# Patient Record
Sex: Male | Born: 1970
Health system: Southern US, Community
[De-identification: ages and names within clinical notes are randomized; demographics above are authoritative.]

## PROBLEM LIST (undated history)

## (undated) DIAGNOSIS — M5416 Radiculopathy, lumbar region: Secondary | ICD-10-CM

## (undated) DIAGNOSIS — S83512A Sprain of anterior cruciate ligament of left knee, initial encounter: Secondary | ICD-10-CM

## (undated) DIAGNOSIS — Z87898 Personal history of other specified conditions: Secondary | ICD-10-CM

## (undated) DIAGNOSIS — K219 Gastro-esophageal reflux disease without esophagitis: Secondary | ICD-10-CM

## (undated) DIAGNOSIS — M545 Low back pain: Principal | ICD-10-CM

## (undated) DIAGNOSIS — Z8719 Personal history of other diseases of the digestive system: Secondary | ICD-10-CM

## (undated) DIAGNOSIS — Z87442 Personal history of urinary calculi: Secondary | ICD-10-CM

## (undated) DIAGNOSIS — M5430 Sciatica, unspecified side: Secondary | ICD-10-CM

## (undated) DIAGNOSIS — R519 Headache, unspecified: Secondary | ICD-10-CM

## (undated) DIAGNOSIS — M1990 Unspecified osteoarthritis, unspecified site: Secondary | ICD-10-CM

## (undated) DIAGNOSIS — G8929 Other chronic pain: Secondary | ICD-10-CM

## (undated) HISTORY — PX: CYST REMOVAL NECK: SHX6281

## (undated) HISTORY — DX: Sciatica, unspecified side: M54.30

## (undated) HISTORY — DX: Sprain of anterior cruciate ligament of left knee, initial encounter: S83.512A

## (undated) HISTORY — DX: Other chronic pain: G89.29

## (undated) HISTORY — PX: KNEE ARTHROSCOPY: SUR90

## (undated) HISTORY — DX: Personal history of other specified conditions: Z87.898

## (undated) HISTORY — DX: Radiculopathy, lumbar region: M54.16

## (undated) HISTORY — DX: Gastro-esophageal reflux disease without esophagitis: K21.9

## (undated) HISTORY — DX: Low back pain: M54.5

## (undated) HISTORY — PX: FOOT SURGERY: SHX648

---

## 2014-12-08 DIAGNOSIS — S83512A Sprain of anterior cruciate ligament of left knee, initial encounter: Secondary | ICD-10-CM

## 2014-12-08 HISTORY — DX: Sprain of anterior cruciate ligament of left knee, initial encounter: S83.512A

## 2016-01-09 ENCOUNTER — Encounter: Payer: Self-pay | Admitting: Family Medicine

## 2016-01-09 ENCOUNTER — Ambulatory Visit (INDEPENDENT_AMBULATORY_CARE_PROVIDER_SITE_OTHER): Payer: BLUE CROSS/BLUE SHIELD | Admitting: Family Medicine

## 2016-01-09 VITALS — BP 139/88 | HR 54 | Ht 65.0 in | Wt 150.6 lb

## 2016-01-09 DIAGNOSIS — M5430 Sciatica, unspecified side: Secondary | ICD-10-CM

## 2016-01-09 DIAGNOSIS — M545 Low back pain, unspecified: Secondary | ICD-10-CM

## 2016-01-09 DIAGNOSIS — Z87898 Personal history of other specified conditions: Secondary | ICD-10-CM

## 2016-01-09 DIAGNOSIS — M5431 Sciatica, right side: Secondary | ICD-10-CM

## 2016-01-09 DIAGNOSIS — G8929 Other chronic pain: Secondary | ICD-10-CM

## 2016-01-09 DIAGNOSIS — M5416 Radiculopathy, lumbar region: Secondary | ICD-10-CM | POA: Insufficient documentation

## 2016-01-09 DIAGNOSIS — K219 Gastro-esophageal reflux disease without esophagitis: Secondary | ICD-10-CM | POA: Diagnosis not present

## 2016-01-09 HISTORY — DX: Sciatica, unspecified side: M54.30

## 2016-01-09 HISTORY — DX: Radiculopathy, lumbar region: M54.16

## 2016-01-09 HISTORY — DX: Low back pain, unspecified: M54.50

## 2016-01-09 MED ORDER — PREDNISONE 10 MG (48) PO TBPK: ORAL_TABLET | Freq: Every day | ORAL | 0 refills | Status: DC

## 2016-01-09 MED ORDER — SCOPOLAMINE 1 MG/3DAYS TD PT72: 1.0000 | MEDICATED_PATCH | TRANSDERMAL | 0 refills | Status: DC

## 2016-01-09 MED ORDER — AMITRIPTYLINE HCL 25 MG PO TABS: ORAL_TABLET | ORAL | 0 refills | Status: DC

## 2016-01-09 MED ORDER — GABAPENTIN 100 MG PO CAPS: ORAL_CAPSULE | ORAL | 0 refills | Status: DC

## 2016-01-09 NOTE — Patient Instructions (Signed)
May ramp up your Neurontin to 3 tablets 3 times daily as needed if you tolerated.     Lumbosacral Radiculopathy Lumbosacral radiculopathy is a condition that involves the spinal nerves and nerve roots in the low back and bottom of the spine. The condition develops when these nerves and nerve roots move out of place or become inflamed and cause symptoms. CAUSES This condition may be caused by:  Pressure from a disk that bulges out of place (herniated disk). A disk is a plate of cartilage that separates bones in the spine.  Disk degeneration.  A narrowing of the bones of the lower back (spinal stenosis).  A tumor.  An infection.  An injury that places sudden pressure on the disks that cushion the bones of your lower spine. RISK FACTORS This condition is more likely to develop in:  Males aged 30-50 years.  Females aged 50-60 years.  People who lift improperly.  People who are overweight or live a sedentary lifestyle.  People who smoke.  People who perform repetitive activities that strain the spine. SYMPTOMS Symptoms of this condition include:  Pain that goes down from the back into the legs (sciatica). This is the most common symptom. The pain may be worse with sitting, coughing, or sneezing.  Pain and numbness in the arms and legs.  Muscle weakness.  Tingling.  Loss of bladder control or bowel control. DIAGNOSIS This condition is diagnosed with a physical exam and medical history. If the pain is lasting, you may have tests, such as:  MRI scan.  X-ray.  CT scan.  Myelogram.  Nerve conduction study. TREATMENT This condition is often treated with:  Hot packs and ice applied to affected areas.  Stretches to improve flexibility.  Exercises to strengthen back muscles.  Physical therapy.  Pain medicine.  A steroid injection in the spine. In some cases, no treatment is needed. If the condition is long-lasting (chronic), or if symptoms are severe,  treatment may involve surgery or lifestyle changes, such as following a weight loss plan. HOME CARE INSTRUCTIONS Medicines  Take medicines only as directed by your health care provider.  Do not drive or operate heavy machinery while taking pain medicine. Injury Care  Apply a heat pack to the injured area as directed by your health care provider.  Apply ice to the affected area:  Put ice in a plastic bag.  Place a towel between your skin and the bag.  Leave the ice on for 20-30 minutes, every 2 hours while you are awake or as needed. Or, leave the ice on for as long as directed by your health care provider. Other Instructions  If you were shown how to do any exercises or stretches, do them as directed by your health care provider.  If your health care provider prescribed a diet or exercise program, follow it as directed.  Keep all follow-up visits as directed by your health care provider. This is important. SEEK MEDICAL CARE IF:  Your pain does not improve over time even when taking pain medicines. SEEK IMMEDIATE MEDICAL CARE IF:  Your develop severe pain.  Your pain suddenly gets worse.  You develop increasing weakness in your legs.  You lose the ability to control your bladder or bowel.  You have difficulty walking or balancing.  You have a fever.   This information is not intended to replace advice given to you by your health care provider. Make sure you discuss any questions you have with your health care provider.  Document Released: 05/27/2005 Document Revised: 10/11/2014 Document Reviewed: 05/23/2014 Elsevier Interactive Patient Education 2016 [employer]   Sciatica Sciatica is pain, weakness, numbness, or tingling along the path of the sciatic nerve. The nerve starts in the lower back and runs down the back of each leg. The nerve controls the muscles in the lower leg and in the back of the knee, while also providing sensation to the back of the thigh, lower  leg, and the sole of your foot. Sciatica is a symptom of another medical condition. For instance, nerve damage or certain conditions, such as a herniated disk or bone spur on the spine, pinch or put pressure on the sciatic nerve. This causes the pain, weakness, or other sensations normally associated with sciatica. Generally, sciatica only affects one side of the body. CAUSES   Herniated or slipped disc.  Degenerative disk disease.  A pain disorder involving the narrow muscle in the buttocks (piriformis syndrome).  Pelvic injury or fracture.  Pregnancy.  Tumor (rare). SYMPTOMS  Symptoms can vary from mild to very severe. The symptoms usually travel from the low back to the buttocks and down the back of the leg. Symptoms can include:  Mild tingling or dull aches in the lower back, leg, or hip.  Numbness in the back of the calf or sole of the foot.  Burning sensations in the lower back, leg, or hip.  Sharp pains in the lower back, leg, or hip.  Leg weakness.  Severe back pain inhibiting movement. These symptoms may get worse with coughing, sneezing, laughing, or prolonged sitting or standing. Also, being overweight may worsen symptoms. DIAGNOSIS  Your caregiver will perform a physical exam to look for common symptoms of sciatica. He or she may ask you to do certain movements or activities that would trigger sciatic nerve pain. Other tests may be performed to find the cause of the sciatica. These may include:  Blood tests.  X-rays.  Imaging tests, such as an MRI or CT scan. TREATMENT  Treatment is directed at the cause of the sciatic pain. Sometimes, treatment is not necessary and the pain and discomfort goes away on its own. If treatment is needed, your caregiver may suggest:  Over-the-counter medicines to relieve pain.  Prescription medicines, such as anti-inflammatory medicine, muscle relaxants, or narcotics.  Applying heat or ice to the painful area.  Steroid injections  to lessen pain, irritation, and inflammation around the nerve.  Reducing activity during periods of pain.  Exercising and stretching to strengthen your abdomen and improve flexibility of your spine. Your caregiver may suggest losing weight if the extra weight makes the back pain worse.  Physical therapy.  Surgery to eliminate what is pressing or pinching the nerve, such as a bone spur or part of a herniated disk. HOME CARE INSTRUCTIONS   Only take over-the-counter or prescription medicines for pain or discomfort as directed by your caregiver.  Apply ice to the affected area for 20 minutes, 3-4 times a day for the first 48-72 hours. Then try heat in the same way.  Exercise, stretch, or perform your usual activities if these do not aggravate your pain.  Attend physical therapy sessions as directed by your caregiver.  Keep all follow-up appointments as directed by your caregiver.  Do not wear high heels or shoes that do not provide proper support.  Check your mattress to see if it is too soft. A firm mattress may lessen your pain and discomfort. SEEK IMMEDIATE MEDICAL CARE IF:   You  lose control of your bowel or bladder (incontinence).  You have increasing weakness in the lower back, pelvis, buttocks, or legs.  You have redness or swelling of your back.  You have a burning sensation when you urinate.  You have pain that gets worse when you lie down or awakens you at night.  Your pain is worse than you have experienced in the past.  Your pain is lasting longer than 4 weeks.  You are suddenly losing weight without reason. MAKE SURE YOU:  Understand these instructions.  Will watch your condition.  Will get help right away if you are not doing well or get worse.   This information is not intended to replace advice given to you by your health care provider. Make sure you discuss any questions you have with your health care provider.   Document Released: 05/21/2001 Document  Revised: 02/15/2015 Document Reviewed: 10/06/2011 Elsevier Interactive Patient Education [employer]

## 2016-01-09 NOTE — Progress Notes (Signed)
New patient office visit note:  Impression and Recommendations:    H/O motion sickness/ seasickness  Scopolamine patch prescription given.   Advised deep sea fishing would be very difficult on his back, and I do not recommend he goes  Right lumbar radiculopathy  His chronic low back pain has gotten worse over the past couple weeks. We will obtain an MRI.    Medications started (which patient has been on in the past for this same condition over a yr ago)  - Neurontin: Patient understands incremental increasing in dose slowly over time  -Elavil at night  -Prednisone for his recent aggravation of his back pain  Sciatic nerve pain- R  Red flag symptoms discussed and patient knows it is an emergency if he develops any of those.  GERD (gastroesophageal reflux disease)  Declines refill on his omeprazole  Chronic lower back pain  I did use 1:30 day supply of Vicodin in the past as needed for short periods of time when his pain became severe like it is now.  Patient declines these meds today. Orders Placed This Encounter  Procedures  . MR Lumbar Spine W Wo Contrast    Patient's Medications  New Prescriptions   AMITRIPTYLINE (ELAVIL) 25 MG TABLET    1-2 daily at bedtime when necessary pain   GABAPENTIN (NEURONTIN) 100 MG CAPSULE    Take 1 tablet nightly for 3 nights, then 1 tab twice a day for 3 days, then one tab 3 times a day   PREDNISONE (STERAPRED UNI-PAK 48 TAB) 10 MG (48) TBPK TABLET    Take by mouth daily. 12 day dosepack po   SCOPOLAMINE (TRANSDERM-SCOP, 1.5 MG,) 1 MG/3DAYS    Place 1 patch (1.5 mg total) onto the skin every 3 (three) days.  Previous Medications   No medications on file  Modified Medications   Modified Medication Previous Medication   OMEPRAZOLE (PRILOSEC) 20 MG CAPSULE omeprazole (PRILOSEC) 20 MG capsule      Take 1 capsule (20 mg total) by mouth daily.    Take 20 mg by mouth daily.  Discontinued Medications   No medications on file     Return in about 3 weeks (around 01/30/2016) for 3-4 wk Follow-up of back pain and starting Neurontin, Elavil, prednisone; follow-up MRI.  The patient was counseled, risk factors were discussed, anticipatory guidance given.  Gross side effects, risk and benefits, and alternatives of medications discussed with patient.  Patient is aware that all medications have potential side effects and we are unable to predict every side effect or drug-drug interaction that may occur.  Expresses verbal understanding and consents to current therapy plan and treatment regimen.  Please see AVS handed out to patient at the end of our visit for further patient instructions/ counseling done pertaining to today's office visit.    Note: This document was prepared using Dragon voice recognition software and may include unintentional dictation errors.  ----------------------------------------------------------------------------------------------------------------------    Subjective:    Chief Complaint  Patient presents with  . Establish Care  . Back Pain    HPI: Steve [name] is a pleasant 45 y.o. male who presents to [facility] today to review their medical history with me and establish care.   CC: "Severe back pain "-    Still with R lower back/ sciatica pain -  4 wks have been bad/ worse than his usual baseline.   Was playing golf this past SUn morning- Got  significantly worse after playing.  Stabbing pain in b/l lower back.  Advil and alleve pm to sleep at night-not really touching it.     At it's W- 10/10 pain and at best over past 2 wks- 2/10 pain.    No loss function/ mvmnt muscles.   Did have cramping in gastroc muscle one time.  Pain not really beyond the knee this time.  Otherwise cannot time feel numb and tingly.  Drinks only 3 bottles water per day.   No time to really work out but does remain to have a very active lifestyle.     - Patient well-known to me in the past  and I had him on Neurontin and Elavil area he also was seen at [facility] orthopedics for this back pain and they did not change the management from what I was already doing for him.  Patient has not had an MRI yet and we did discuss many months ago that that would be the next steps if he continued to have pain.    Patient is going on a fishing trip in the near future for many days and desires that "patch behind my ear ".  He has had in the past and it worked well. He had no side effects to the medicine.     No other complaints or concerns.   Patient Active Problem List   Diagnosis Date Noted  . GERD (gastroesophageal reflux disease) 01/12/2016  . H/O motion sickness/ seasickness 01/12/2016  . Sciatic nerve pain- R 01/09/2016  . Chronic lower back pain 01/09/2016  . Right lumbar radiculopathy 01/09/2016  . h/o Left ACL tear- s/p repair. 12/08/2014     History reviewed. No pertinent past medical history.   Past Surgical History:  Procedure Laterality Date  . KNEE ARTHROSCOPY Left      History reviewed. No pertinent family history.   History  Drug Use No    History  Alcohol Use No    History  Smoking Status  . Never Smoker  Smokeless Tobacco  . Never Used    Patient's Medications  New Prescriptions   AMITRIPTYLINE (ELAVIL) 25 MG TABLET    1-2 daily at bedtime when necessary pain   GABAPENTIN (NEURONTIN) 100 MG CAPSULE    Take 1 tablet nightly for 3 nights, then 1 tab twice a day for 3 days, then one tab 3 times a day   PREDNISONE (STERAPRED UNI-PAK 48 TAB) 10 MG (48) TBPK TABLET    Take by mouth daily. 12 day dosepack po   SCOPOLAMINE (TRANSDERM-SCOP, 1.5 MG,) 1 MG/3DAYS    Place 1 patch (1.5 mg total) onto the skin every 3 (three) days.  Previous Medications   No medications on file  Modified Medications   Modified Medication Previous Medication   OMEPRAZOLE (PRILOSEC) 20 MG CAPSULE omeprazole (PRILOSEC) 20 MG capsule      Take 1 capsule (20 mg total) by mouth  daily.    Take 20 mg by mouth daily.  Discontinued Medications   No medications on file    Allergies: Oxycodone  Review of Systems:   ( Completed via Adult Medical History Intake form today ) General:  Denies fever, chills, appetite changes, unexplained weight loss.  Optho/Auditory:   Denies visual changes, blurred vision/LOV, ringing in ears/ diff hearing Respiratory:   Denies SOB, DOE, cough, wheezing.  Cardiovascular:   Denies chest pain, palpitations, new onset peripheral edema  Gastrointestinal:   Denies nausea, vomiting, diarrhea.  Genitourinary:    Denies  dysuria, increased frequency, flank pain.  Endocrine:     Denies hot or cold intolerance, polyuria, polydipsia. Musculoskeletal:  Denies unexplained myalgias, joint swelling, + arthralgias,+gait problems when pain gets bad.  Skin:  Denies rash, suspicious lesions or new/ changes in moles Neurological:    Denies dizziness, syncope, unexplained weakness, lightheadedness, no numbness Psychiatric/Behavioral:   Denies mood changes, suicidal or homicidal ideations, hallucinations    Objective:   Blood pressure 139/88, pulse (!) 54, height 5\' 5"  (1.651 m), weight 150 lb 9.6 oz (68.3 kg). Body mass index is 25.06 kg/m.   General: Well Developed, well nourished, and in no acute distress.  Neuro: Alert and oriented x3, extra-ocular muscles intact, sensation grossly intact.  HEENT: Normocephalic, atraumatic, pupils equal round reactive to light, neck supple, no gross masses, no carotid bruits, no JVD apprec Skin: no gross suspicious lesions or rashes  Cardiac: Regular rate and rhythm, no murmurs rubs or gallops.  Respiratory: Essentially clear to auscultation bilaterally. Not using accessory muscles, speaking in full sentences.  Abdominal: Soft, not grossly distended Musculoskeletal: Ambulates w/o diff, FROM * 4 ext.  Vasc: less 2 sec cap RF, warm and pink  Psych:  No HI/SI, judgement and insight good.

## 2016-01-10 ENCOUNTER — Other Ambulatory Visit: Payer: Self-pay

## 2016-01-10 MED ORDER — OMEPRAZOLE 20 MG PO CPDR: 20.0000 mg | DELAYED_RELEASE_CAPSULE | Freq: Every day | ORAL | 5 refills | Status: DC

## 2016-01-12 ENCOUNTER — Ambulatory Visit (HOSPITAL_COMMUNITY): Admission: RE | Admit: 2016-01-12 | Payer: BLUE CROSS/BLUE SHIELD | Source: Ambulatory Visit

## 2016-01-12 ENCOUNTER — Encounter: Payer: Self-pay | Admitting: Family Medicine

## 2016-01-12 DIAGNOSIS — K219 Gastro-esophageal reflux disease without esophagitis: Secondary | ICD-10-CM

## 2016-01-12 DIAGNOSIS — Z87898 Personal history of other specified conditions: Secondary | ICD-10-CM

## 2016-01-12 HISTORY — DX: Personal history of other specified conditions: Z87.898

## 2016-01-12 HISTORY — DX: Gastro-esophageal reflux disease without esophagitis: K21.9

## 2016-01-12 NOTE — Assessment & Plan Note (Signed)
I did use 1:30 day supply of Vicodin in the past as needed for short periods of time when his pain became severe like it is now.  Patient declines these meds today.

## 2016-01-12 NOTE — Assessment & Plan Note (Signed)
Red flag symptoms discussed and patient knows it is an emergency if he develops any of those.

## 2016-01-12 NOTE — Assessment & Plan Note (Signed)
His chronic low back pain has gotten worse over the past couple weeks. We will obtain an MRI.   Medications started (which patient has been on in the past for this same condition over a yr ago) - Neurontin: Patient understands incremental increasing in dose slowly over time -Elavil at night -Prednisone for his recent aggravation of his back pain

## 2016-01-12 NOTE — Assessment & Plan Note (Signed)
Declines refill on his omeprazole

## 2016-01-12 NOTE — Assessment & Plan Note (Signed)
Scopolamine patch prescription given.  Advised deep sea fishing would be very difficult on his back, and I do not recommend he goes

## 2016-01-30 ENCOUNTER — Ambulatory Visit (INDEPENDENT_AMBULATORY_CARE_PROVIDER_SITE_OTHER): Payer: BLUE CROSS/BLUE SHIELD | Admitting: Family Medicine

## 2016-01-30 ENCOUNTER — Encounter: Payer: Self-pay | Admitting: Family Medicine

## 2016-01-30 VITALS — BP 135/89 | HR 63 | Wt 150.1 lb

## 2016-01-30 DIAGNOSIS — M545 Low back pain: Secondary | ICD-10-CM | POA: Diagnosis not present

## 2016-01-30 DIAGNOSIS — M5431 Sciatica, right side: Secondary | ICD-10-CM

## 2016-01-30 DIAGNOSIS — R030 Elevated blood-pressure reading, without diagnosis of hypertension: Secondary | ICD-10-CM

## 2016-01-30 DIAGNOSIS — G8929 Other chronic pain: Secondary | ICD-10-CM

## 2016-01-30 DIAGNOSIS — Z7189 Other specified counseling: Secondary | ICD-10-CM

## 2016-01-30 DIAGNOSIS — Z630 Problems in relationship with spouse or partner: Secondary | ICD-10-CM | POA: Diagnosis not present

## 2016-01-30 MED ORDER — OMEPRAZOLE 20 MG PO CPDR: 20.0000 mg | DELAYED_RELEASE_CAPSULE | Freq: Every day | ORAL | 1 refills | Status: DC

## 2016-01-30 MED ORDER — GABAPENTIN 100 MG PO CAPS: ORAL_CAPSULE | ORAL | 1 refills | Status: DC

## 2016-01-30 NOTE — Patient Instructions (Signed)
     Mediterranean Diet  Why follow it? Research shows. . Those who follow the Mediterranean diet have a reduced risk of heart disease  . The diet is associated with a reduced incidence of Parkinson's and Alzheimer's diseases . People following the diet may have longer life expectancies and lower rates of chronic diseases  . The Dietary Guidelines for Americans recommends the Mediterranean diet as an eating plan to promote health and prevent disease  What Is the Mediterranean Diet?  . Healthy eating plan based on typical foods and recipes of Mediterranean-style cooking . The diet is primarily a plant based diet; these foods should make up a majority of meals   Starches - Plant based foods should make up a majority of meals - They are an important sources of vitamins, minerals, energy, antioxidants, and fiber - Choose whole grains, foods high in fiber and minimally processed items  - Typical grain sources include wheat, oats, barley, corn, brown rice, bulgar, farro, millet, polenta, couscous  - Various types of beans include chickpeas, lentils, fava beans, black beans, white beans   Fruits  Veggies - Large quantities of antioxidant rich fruits & veggies; 6 or more servings  - Vegetables can be eaten raw or lightly drizzled with oil and cooked  - Vegetables common to the traditional Mediterranean Diet include: artichokes, arugula, beets, broccoli, brussel sprouts, cabbage, carrots, celery, collard greens, cucumbers, eggplant, kale, leeks, lemons, lettuce, mushrooms, okra, onions, peas, peppers, potatoes, pumpkin, radishes, rutabaga, shallots, spinach, sweet potatoes, turnips, zucchini - Fruits common to the Mediterranean Diet include: apples, apricots, avocados, cherries, clementines, dates, figs, grapefruits, grapes, melons, nectarines, oranges, peaches, pears, pomegranates, strawberries, tangerines  Fats - Replace butter and margarine with healthy oils, such as olive oil, canola oil, and  tahini  - Limit nuts to no more than a handful a day  - Nuts include walnuts, almonds, pecans, pistachios, pine nuts  - Limit or avoid candied, honey roasted or heavily salted nuts - Olives are central to the Mediterranean diet - can be eaten whole or used in a variety of dishes   Meats Protein - Limiting red meat: no more than a few times a month - When eating red meat: choose lean cuts and keep the portion to the size of deck of cards - Eggs: approx. 0 to 4 times a week  - Fish and lean poultry: at least 2 a week  - Healthy protein sources include, chicken, turkey, lean beef, lamb - Increase intake of seafood such as tuna, salmon, trout, mackerel, shrimp, scallops - Avoid or limit high fat processed meats such as sausage and bacon  Dairy - Include moderate amounts of low fat dairy products  - Focus on healthy dairy such as fat free yogurt, skim milk, low or reduced fat cheese - Limit dairy products higher in fat such as whole or 2% milk, cheese, ice cream  Alcohol - Moderate amounts of red wine is ok  - No more than 5 oz daily for women (all ages) and men older than age 65  - No more than 10 oz of wine daily for men younger than 65  Other - Limit sweets and other desserts  - Use herbs and spices instead of salt to flavor foods  - Herbs and spices common to the traditional Mediterranean Diet include: basil, bay leaves, chives, cloves, cumin, fennel, garlic, lavender, marjoram, mint, oregano, parsley, pepper, rosemary, sage, savory, sumac, tarragon, thyme   It's not just a diet, it's   a lifestyle:  . The Mediterranean diet includes lifestyle factors typical of those in the region  . Foods, drinks and meals are best eaten with others and savored . Daily physical activity is important for overall good health . This could be strenuous exercise like running and aerobics . This could also be more leisurely activities such as walking, housework, yard-work, or taking the stairs . Moderation is  the key; a balanced and healthy diet accommodates most foods and drinks . Consider portion sizes and frequency of consumption of certain foods   Meal Ideas & Options:  . Breakfast:  o Whole wheat toast or whole wheat English muffins with peanut butter & hard boiled egg o Steel cut oats topped with apples & cinnamon and skim milk  o Fresh fruit: banana, strawberries, melon, berries, peaches  o Smoothies: strawberries, bananas, greek yogurt, peanut butter o Low fat greek yogurt with blueberries and granola  o Egg white omelet with spinach and mushrooms o Breakfast couscous: whole wheat couscous, apricots, skim milk, cranberries  . Sandwiches:  o Hummus and grilled vegetables (peppers, zucchini, squash) on whole wheat bread   o Grilled chicken on whole wheat pita with lettuce, tomatoes, cucumbers or tzatziki  o Tuna salad on whole wheat bread: tuna salad made with greek yogurt, olives, red peppers, capers, green onions o Garlic rosemary lamb pita: lamb sauted with garlic, rosemary, salt & pepper; add lettuce, cucumber, greek yogurt to pita - flavor with lemon juice and black pepper  . Seafood:  o Mediterranean grilled salmon, seasoned with garlic, basil, parsley, lemon juice and black pepper o Shrimp, lemon, and spinach whole-grain pasta salad made with low fat greek yogurt  o Seared scallops with lemon orzo  o Seared tuna steaks seasoned salt, pepper, coriander topped with tomato mixture of olives, tomatoes, olive oil, minced garlic, parsley, green onions and cappers  . Meats:  o Herbed greek chicken salad with kalamata olives, cucumber, feta  o Red bell peppers stuffed with spinach, bulgur, lean ground beef (or lentils) & topped with feta   o Kebabs: skewers of chicken, tomatoes, onions, zucchini, squash  o Turkey burgers: made with red onions, mint, dill, lemon juice, feta cheese topped with roasted red peppers . Vegetarian o Cucumber salad: cucumbers, artichoke hearts, celery, red  onion, feta cheese, tossed in olive oil & lemon juice  o Hummus and whole grain pita points with a greek salad (lettuce, tomato, feta, olives, cucumbers, red onion) o Lentil soup with celery, carrots made with vegetable broth, garlic, salt and pepper  o Tabouli salad: parsley, bulgur, mint, scallions, cucumbers, tomato, radishes, lemon juice, olive oil, salt and pepper.  

## 2016-01-30 NOTE — Progress Notes (Signed)
Impression and Recommendations:    1. Sciatic nerve pain, right   2. Chronic lower back pain   3. Counseling on health promotion and disease prevention   4. Stress due to marital problems   5. Elevated blood pressure reading without diagnosis of hypertension     Sciatic nerve pain- R Patient has not been taking his Sterapred Dosepak properly. He takes 1 tablet in the morning and 1 tablet in the afternoon. I explained to him there are instructions and he was started that he did not read them.  Discussed with patient risks associated with 20 days of steroid use and he must wean down slowly.     Strongly encouraged patient to read labels and just follow instructions on prescriptions in future  Continue Neurontin and Elavil at current doses without increase since patient doing so well.  Chronic lower back pain Patient continues to do exercises that I showed him of lumbar extensions and P form of stretches. He says these helped tremendously as well as the medications. Strongly encouraged to do this daily.   Adequate hydration encouraged as well  Stress due to marital conflict Discussed with patient his wife's current medical problems to help him get perspective. All questions were answered.  His many questions about sex after total abdominal hysterectomy and chronic prevent wife from getting orgasmic migraines etc.  Elevated blood pressure reading without diagnosis of hypertension Explained goal blood pressure healthy gentleman his age would be 130/80 or less. Patient states he has concerns as his blood pressure has been creeping up over the past several months.  Encouraged to follow Mediterranean diet, low-salt, 30 minutes moderate intensity aerobic activity daily and manage stress.  Try to check blood pressure at home if possible. Recheck in 2 months. Return to clinic sooner if concerns  Pt was in the office today for 40+ minutes, with over 50% time spent in face to face  counseling of various medical concerns and in coordination of care  Patient's Medications  New Prescriptions   No medications on file  Previous Medications   AMITRIPTYLINE (ELAVIL) 25 MG TABLET    1-2 daily at bedtime when necessary pain   PREDNISONE (STERAPRED UNI-PAK 48 TAB) 10 MG (48) TBPK TABLET    Take by mouth daily. 12 day dosepack po   SCOPOLAMINE (TRANSDERM-SCOP, 1.5 MG,) 1 MG/3DAYS    Place 1 patch (1.5 mg total) onto the skin every 3 (three) days.  Modified Medications   Modified Medication Previous Medication   GABAPENTIN (NEURONTIN) 100 MG CAPSULE gabapentin (NEURONTIN) 100 MG capsule      one tab 3 times a day    Take 1 tablet nightly for 3 nights, then 1 tab twice a day for 3 days, then one tab 3 times a day   OMEPRAZOLE (PRILOSEC) 20 MG CAPSULE omeprazole (PRILOSEC) 20 MG capsule      Take 1 capsule (20 mg total) by mouth daily.    Take 1 capsule (20 mg total) by mouth daily.  Discontinued Medications   No medications on file    Return in about 8 weeks (around 03/26/2016) for come in fasting labs and yrly PE.  The patient was counseled, risk factors were discussed, anticipatory guidance given.  Gross side effects, risk and benefits, and alternatives of medications discussed with patient.  Patient is aware that all medications have potential side effects and we are unable to predict every side effect or drug-drug interaction that may occur.  Expresses verbal  understanding and consents to current therapy plan and treatment regimen.  Please see AVS handed out to patient at the end of our visit for further patient instructions/ counseling done pertaining to today's office visit.    Note: This document was prepared using Dragon voice recognition software and may include unintentional dictation  errors.   --------------------------------------------------------------------------------------------------------------------------------------------------------------------------------------------------------------------------------------------    Subjective:    CC:  Chief Complaint  Patient presents with  . Back Pain    follow up    HPI: Steve [name] is a 45 y.o. male who presents to [facility] today for issues as discussed below.   Right lumbar radiculopathy\sciatic nerve pain on the right: Patient did not go for his MRI of the back.  Pt would have to pay 1400 dollars- so not getting it done.    - Pain today is 1/10 now at worst. Pt essentially has no pain anymore!  He is able to do everything he wants to at work and at home.    He is very pleased.  He is sleeping much better.  No side effects of medications, tolerating well.  He has questions about his wife's health today.  She recently had a hysterectomy due to menorrhagia.  Patient is upset because wife is not having intercourse with him.    She also suffers from migraines associated with orgasms.   He asked me today about this and wanted to see if I recommend any specific treatment. In the remote past, several months ago, I had her on a triptan- which worked well.    Wt Readings from Last 3 Encounters:  01/30/16 150 lb 1.6 oz (68.1 kg)  01/09/16 150 lb 9.6 oz (68.3 kg)   BP Readings from Last 3 Encounters:  01/30/16 135/89  01/09/16 139/88   Pulse Readings from Last 3 Encounters:  01/30/16 63  01/09/16 (!) 54     Patient Active Problem List   Diagnosis Date Noted  . Stress due to marital conflict 02/02/2016  . Counseling on health promotion and disease prevention 02/02/2016  . Elevated blood pressure reading without diagnosis of hypertension 02/02/2016  . GERD (gastroesophageal reflux disease) 01/12/2016  . H/O motion sickness/ seasickness 01/12/2016  . Sciatic nerve pain- R  01/09/2016  . Chronic lower back pain 01/09/2016  . Right lumbar radiculopathy 01/09/2016  . h/o Left ACL tear- s/p repair. 12/08/2014    Past Medical history, Surgical history, Family history, Social history, Allergies and Medications have been entered into the medical record, reviewed and changed as needed.   Allergies:  Allergies  Allergen Reactions  . Oxycodone Nausea And Vomiting    Review of Systems: No fever/ chills, night sweats, no unintended weight loss, No chest pain, or increased shortness of breath. No N/V/D.  Pertinent positives and negatives noted in HPI above    Objective:   Blood pressure 135/89, pulse 63, weight 150 lb 1.6 oz (68.1 kg). Body mass index is 24.98 kg/m.  General: Well Developed, well nourished, appropriate for stated age.  Neuro: Alert and oriented x3, extra-ocular muscles intact, sensation grossly intact, neurovascularly intact bilateral lower extremities.  HEENT: Normocephalic, atraumatic, neck supple   Skin: Warm and dry, no gross rash. Cardiac: RRR, S1 S2,  no murmurs rubs or gallops.  Respiratory: ECTA B/L, Not using accessory muscles, speaking in full sentences-unlabored. M-SK: Significant bilateral paraspinal muscle spasms greatest on the right from t 5 to about L1 Vascular:  No gross lower ext edema, cap RF less 2  sec. Psych: No SI/HI, Insight and judgement good

## 2016-02-02 DIAGNOSIS — Z630 Problems in relationship with spouse or partner: Secondary | ICD-10-CM | POA: Insufficient documentation

## 2016-02-02 DIAGNOSIS — R030 Elevated blood-pressure reading, without diagnosis of hypertension: Secondary | ICD-10-CM | POA: Insufficient documentation

## 2016-02-02 DIAGNOSIS — Z7189 Other specified counseling: Secondary | ICD-10-CM | POA: Insufficient documentation

## 2016-02-02 NOTE — Assessment & Plan Note (Addendum)
Discussed with patient his wife's current medical problems to help him get perspective. All questions were answered.  His many questions about sex after total abdominal hysterectomy and chronic prevent wife from getting orgasmic migraines etc.

## 2016-02-02 NOTE — Assessment & Plan Note (Signed)
Explained goal blood pressure healthy gentleman his age would be 130/80 or less. Patient states he has concerns as his blood pressure has been creeping up over the past several months.  Encouraged to follow Mediterranean diet, low-salt, 30 minutes moderate intensity aerobic activity daily and manage stress.  Try to check blood pressure at home if possible. Recheck in 2 months. Return to clinic sooner if concerns

## 2016-02-02 NOTE — Assessment & Plan Note (Addendum)
Patient has not been taking his Sterapred Dosepak properly. He takes 1 tablet in the morning and 1 tablet in the afternoon. I explained to him there are instructions and he was started that he did not read them.  Discussed with patient risks associated with 20 days of steroid use and he must wean down slowly.     Strongly encouraged patient to read labels and just follow instructions on prescriptions in future  Continue Neurontin and Elavil at current doses without increase since patient doing so well.

## 2016-02-02 NOTE — Assessment & Plan Note (Signed)
Patient continues to do exercises that I showed him of lumbar extensions and P form of stretches. He says these helped tremendously as well as the medications. Strongly encouraged to do this daily.   Adequate hydration encouraged as well

## 2016-03-21 ENCOUNTER — Ambulatory Visit (INDEPENDENT_AMBULATORY_CARE_PROVIDER_SITE_OTHER): Payer: BLUE CROSS/BLUE SHIELD | Admitting: Family Medicine

## 2016-03-21 ENCOUNTER — Encounter: Payer: Self-pay | Admitting: Family Medicine

## 2016-03-21 VITALS — BP 115/80 | HR 71 | Ht 65.0 in | Wt 150.5 lb

## 2016-03-21 DIAGNOSIS — Z23 Encounter for immunization: Secondary | ICD-10-CM | POA: Diagnosis not present

## 2016-03-21 DIAGNOSIS — Z1211 Encounter for screening for malignant neoplasm of colon: Secondary | ICD-10-CM | POA: Diagnosis not present

## 2016-03-21 DIAGNOSIS — Z0000 Encounter for general adult medical examination without abnormal findings: Secondary | ICD-10-CM | POA: Diagnosis not present

## 2016-03-21 DIAGNOSIS — M5431 Sciatica, right side: Secondary | ICD-10-CM

## 2016-03-21 DIAGNOSIS — M5416 Radiculopathy, lumbar region: Secondary | ICD-10-CM | POA: Diagnosis not present

## 2016-03-21 DIAGNOSIS — K219 Gastro-esophageal reflux disease without esophagitis: Secondary | ICD-10-CM | POA: Diagnosis not present

## 2016-03-21 LAB — POC HEMOCCULT BLD/STL (OFFICE/1-CARD/DIAGNOSTIC): Fecal Occult Blood, POC: NEGATIVE

## 2016-03-21 MED ORDER — AMITRIPTYLINE HCL 25 MG PO TABS: ORAL_TABLET | ORAL | 0 refills | Status: DC

## 2016-03-21 MED ORDER — OMEPRAZOLE 20 MG PO CPDR: 20.0000 mg | DELAYED_RELEASE_CAPSULE | Freq: Every day | ORAL | 1 refills | Status: DC

## 2016-03-21 MED ORDER — GABAPENTIN 100 MG PO CAPS
ORAL_CAPSULE | ORAL | 1 refills | Status: DC
Start: 2016-03-21 — End: 2016-11-07

## 2016-03-21 NOTE — Progress Notes (Signed)
Male physical  Impression and Recommendations:    1. Encounter for wellness examination   2. Sciatic nerve pain, right   3. Right lumbar radiculopathy   4. Gastroesophageal reflux disease, esophagitis presence not specified   5. Screening for colon cancer   6. Need for Tdap vaccination    - Medications refilled  Orders Placed This Encounter  Procedures  . Tdap vaccine greater than or equal to 45yo IM  . COMPLETE METABOLIC PANEL WITH GFR  . CBC with Differential/Platelet  . Lipid panel  . TSH  . VITAMIN D 25 Hydroxy (Vit-D Deficiency, Fractures)  . Vitamin B12  . Hemoglobin A1c  . POC Hemoccult Bld/Stl (1-Cd Office Dx)    Patient's Medications  New Prescriptions   No medications on file  Previous Medications   No medications on file  Modified Medications   Modified Medication Previous Medication   AMITRIPTYLINE (ELAVIL) 25 MG TABLET amitriptyline (ELAVIL) 25 MG tablet      1-2 daily at bedtime when necessary pain    1-2 daily at bedtime when necessary pain   GABAPENTIN (NEURONTIN) 100 MG CAPSULE gabapentin (NEURONTIN) 100 MG capsule      one tab 3 times a day    one tab 3 times a day   OMEPRAZOLE (PRILOSEC) 20 MG CAPSULE omeprazole (PRILOSEC) 20 MG capsule      Take 1 capsule (20 mg total) by mouth daily.    Take 1 capsule (20 mg total) by mouth daily.  Discontinued Medications   PREDNISONE (STERAPRED UNI-PAK 48 TAB) 10 MG (48) TBPK TABLET    Take by mouth daily. 12 day dosepack po   SCOPOLAMINE (TRANSDERM-SCOP, 1.5 MG,) 1 MG/3DAYS    Place 1 patch (1.5 mg total) onto the skin every 3 (three) days.   Gross side effects, risk and benefits, and alternatives of medications discussed with patient.  Patient is aware that all medications have potential side effects and we are unable to predict every side effect or drug-drug interaction that may occur.  Expresses verbal understanding and consents to current therapy plan and treatment regimen.    Please see AVS handed out  to patient at the end of our visit for further patient instructions/ counseling done pertaining to today's office visit.  1) Anticipatory Guidance: Discussed importance of wearing a seatbelt while driving, not texting while driving;   sunscreen when outside along with skin surveillance; eating a balanced and modest diet; physical activity at least 25 minutes per day or 150 min/ week moderate to intense activity.  2) Immunizations / Screenings / Labs:  All immunizations are up-to-date per recommendations or will be updated today. Patient is due for dental and vision screens which pt will schedule independently.   3) Weight:   BMI meaning discussed with patient.  Discussed goal of losing 5-10% of current body weight which would improve overall feelings of well being and improve objective health data. Improve nutrient density of diet through increasing intake of fruits and vegetables and decreasing saturated fats, white flour products and refined sugars.   Follow-up preventative CPE in 1 year. Follow-up office visit pending lab work.  F/up sooner for chronic care management and/or prn    Subjective:    CC: CPE  HPI: Steve [name] is a 45 y.o. male who presents to [facility] today for a yearly health maintenance exam.    Health Maintenance Summary Reviewed and updated, unless pt declines services.  Colonoscopy: never Tobacco History  Reviewed: never Alcohol: No concerns, no excessive use Exercise Habits: yes STD concerns: none Drug Use: None Birth control method: n/a- HYst Testicular/penile concerns: no Cancer Family History: no  Eye exam- q 2 yrs.   ---Limited family history Doesn't know his biological father.    Wt Readings from Last 3 Encounters:  03/21/16 150 lb 8 oz (68.3 kg)  01/30/16 150 lb 1.6 oz (68.1 kg)  01/09/16 150 lb 9.6 oz (68.3 kg)   BP Readings from Last 3 Encounters:  03/21/16 115/80  01/30/16 135/89  01/09/16 139/88   Pulse  Readings from Last 3 Encounters:  03/21/16 71  01/30/16 63  01/09/16 (!) 54    Patient Active Problem List   Diagnosis Date Noted  . Stress due to marital conflict 02/02/2016  . Counseling on health promotion and disease prevention 02/02/2016  . Elevated blood pressure reading without diagnosis of hypertension 02/02/2016  . GERD (gastroesophageal reflux disease) 01/12/2016  . H/O motion sickness/ seasickness 01/12/2016  . Sciatic nerve pain- R 01/09/2016  . Chronic lower back pain 01/09/2016  . Right lumbar radiculopathy 01/09/2016  . h/o Left ACL tear- s/p repair. 12/08/2014    Past Medical History:  Diagnosis Date  . Chronic lower back pain 01/09/2016  . GERD (gastroesophageal reflux disease) 01/12/2016  . h/o Left ACL tear- s/p repair. 12/08/2014  . H/O motion sickness/ seasickness 01/12/2016  . Right lumbar radiculopathy 01/09/2016  . Sciatic nerve pain- R 01/09/2016    Past Surgical History:  Procedure Laterality Date  . KNEE ARTHROSCOPY Left     Family History  Problem Relation Age of Onset  . Cancer Maternal Grandfather     bone    History  Drug Use No  ,  History  Alcohol Use No  ,  History  Smoking Status  . Never Smoker  Smokeless Tobacco  . Never Used  ,  History  Sexual Activity  . Sexual activity: Yes    Patient's Medications  New Prescriptions   No medications on file  Previous Medications   No medications on file  Modified Medications   Modified Medication Previous Medication   AMITRIPTYLINE (ELAVIL) 25 MG TABLET amitriptyline (ELAVIL) 25 MG tablet      1-2 daily at bedtime when necessary pain    1-2 daily at bedtime when necessary pain   GABAPENTIN (NEURONTIN) 100 MG CAPSULE gabapentin (NEURONTIN) 100 MG capsule      one tab 3 times a day    one tab 3 times a day   OMEPRAZOLE (PRILOSEC) 20 MG CAPSULE omeprazole (PRILOSEC) 20 MG capsule      Take 1 capsule (20 mg total) by mouth daily.    Take 1 capsule (20 mg total) by mouth daily.    Discontinued Medications   PREDNISONE (STERAPRED UNI-PAK 48 TAB) 10 MG (48) TBPK TABLET    Take by mouth daily. 12 day dosepack po   SCOPOLAMINE (TRANSDERM-SCOP, 1.5 MG,) 1 MG/3DAYS    Place 1 patch (1.5 mg total) onto the skin every 3 (three) days.    Oxycodone  Review of Systems  Constitutional: Negative.  Negative for chills, diaphoresis, fever, malaise/fatigue and weight loss.  HENT: Negative.  Negative for congestion, sore throat and tinnitus.   Eyes: Negative.  Negative for blurred vision, double vision and photophobia.  Respiratory: Negative.  Negative for cough and wheezing.   Cardiovascular: Negative.  Negative for chest pain and palpitations.  Gastrointestinal: Negative.  Negative for blood in stool, diarrhea, nausea and vomiting.  Genitourinary: Negative.  Negative for dysuria, frequency and urgency.  Musculoskeletal: Positive for joint pain. Negative for myalgias.       Right middle finger   Skin: Negative.  Negative for itching and rash.  Neurological: Negative.  Negative for dizziness, focal weakness, weakness and headaches.  Endo/Heme/Allergies: Negative.  Negative for environmental allergies and polydipsia. Does not bruise/bleed easily.  Psychiatric/Behavioral: Negative.  Negative for depression and memory loss. The patient is not nervous/anxious and does not have insomnia.      Objective:     Blood pressure 115/80, pulse 71, height 5\' 5"  (1.651 m), weight 150 lb 8 oz (68.3 kg). Body mass index is 25.04 kg/m. General Appearance:    Alert, cooperative, no distress, appears stated age  Head:    Normocephalic, without obvious abnormality, atraumatic  Eyes:    PERRL, conjunctiva/corneas clear, EOM's intact, fundi    benign, both eyes  Ears:    Normal TM's and external ear canals, both ears  Nose:   Nares normal, septum midline, mucosa normal, no drainage    or sinus tenderness  Throat:   Lips w/o lesion, mucosa moist, and tongue normal; teeth and   gums normal   Neck:   Supple, symmetrical, trachea midline, no adenopathy;    thyroid:  no enlargement/tenderness/nodules; no carotid   bruit or JVD  Back:     Symmetric, no curvature, ROM normal, no CVA tenderness  Lungs:     Clear to auscultation bilaterally, respirations unlabored, no Wh/ R/ R  Chest Wall:    No tenderness or gross deformity; normal excursion   Heart:    Regular rate and rhythm, S1 and S2 normal, no murmur, rub   or gallop  Abdomen:     Soft, non-tender, bowel sounds active all four quadrants, NO G/R/R, no masses, no organomegaly  Genitalia:    Ext genitalia: without lesion, no penile rash or discharge, no hernias appreciated   Rectal:    Normal tone, prostate WNL's and equal b/l, no tenderness; guaiac negative stool  Extremities:   Extremities normal, atraumatic, no cyanosis or gross edema  Pulses:   2+ and symmetric all extremities  Skin:   Warm, dry, Skin color, texture, turgor normal, no obvious rashes or lesions  M-Sk:   Ambulates * 4 w/o difficulty, no gross deformities, tone WNL  Neurologic:   CNII-XII intact, normal strength, sensation and reflexes    Throughout Psych:  No HI/SI, judgement and insight good, Euthymic mood. Full Affect.

## 2016-03-21 NOTE — Patient Instructions (Signed)

## 2016-03-22 LAB — CBC WITH DIFFERENTIAL/PLATELET
BASOS ABS: 58 {cells}/uL (ref 0–200)
Basophils Relative: 1 %
EOS PCT: 4 %
Eosinophils Absolute: 232 cells/uL (ref 15–500)
HCT: 46.8 % (ref 38.5–50.0)
HEMOGLOBIN: 15.9 g/dL (ref 13.2–17.1)
LYMPHS ABS: 2204 {cells}/uL (ref 850–3900)
LYMPHS PCT: 38 %
MCH: 28.4 pg (ref 27.0–33.0)
MCHC: 34 g/dL (ref 32.0–36.0)
MCV: 83.7 fL (ref 80.0–100.0)
MONOS PCT: 9 %
MPV: 10.2 fL (ref 7.5–12.5)
Monocytes Absolute: 522 cells/uL (ref 200–950)
NEUTROS PCT: 48 %
Neutro Abs: 2784 cells/uL (ref 1500–7800)
PLATELETS: 237 10*3/uL (ref 140–400)
RBC: 5.59 MIL/uL (ref 4.20–5.80)
RDW: 13.9 % (ref 11.0–15.0)
WBC: 5.8 10*3/uL (ref 3.8–10.8)

## 2016-03-22 LAB — LIPID PANEL
CHOL/HDL RATIO: 5.5 ratio — AB (ref <=5.0)
Cholesterol: 221 mg/dL — ABNORMAL HIGH (ref 125–200)
HDL: 40 mg/dL (ref >=40)
LDL CALC: 161 mg/dL — AB (ref <130)
TRIGLYCERIDES: 100 mg/dL (ref <150)
VLDL: 20 mg/dL (ref <30)

## 2016-03-22 LAB — COMPLETE METABOLIC PANEL WITH GFR
ALBUMIN: 4.4 g/dL (ref 3.6–5.1)
ALK PHOS: 61 U/L (ref 40–115)
ALT: 41 U/L (ref 9–46)
AST: 31 U/L (ref 10–40)
BILIRUBIN TOTAL: 1.3 mg/dL — AB (ref 0.2–1.2)
BUN: 14 mg/dL (ref 7–25)
CALCIUM: 10 mg/dL (ref 8.6–10.3)
CO2: 27 mmol/L (ref 20–31)
Chloride: 103 mmol/L (ref 98–110)
Creat: 1.01 mg/dL (ref 0.60–1.35)
GFR, Est African American: >89 mL/min (ref >=60)
GFR, Est Non African American: 89 mL/min (ref >=60)
Glucose, Bld: 83 mg/dL (ref 65–99)
POTASSIUM: 4.6 mmol/L (ref 3.5–5.3)
Sodium: 140 mmol/L (ref 135–146)
TOTAL PROTEIN: 7.4 g/dL (ref 6.1–8.1)

## 2016-03-22 LAB — TSH: TSH: 2.27 mIU/L (ref 0.40–4.50)

## 2016-03-22 LAB — HEMOGLOBIN A1C
Hgb A1c MFr Bld: 5.1 % (ref <5.7)
Mean Plasma Glucose: 100 mg/dL

## 2016-03-22 LAB — VITAMIN B12: VITAMIN B 12: 381 pg/mL (ref 200–1100)

## 2016-03-22 LAB — VITAMIN D 25 HYDROXY (VIT D DEFICIENCY, FRACTURES): Vit D, 25-Hydroxy: 32 ng/mL (ref 30–100)

## 2016-04-05 ENCOUNTER — Other Ambulatory Visit: Payer: Self-pay | Admitting: Family Medicine

## 2016-04-13 ENCOUNTER — Other Ambulatory Visit: Payer: Self-pay | Admitting: Family Medicine

## 2016-04-26 ENCOUNTER — Other Ambulatory Visit: Payer: Self-pay | Admitting: Family Medicine

## 2016-10-15 ENCOUNTER — Ambulatory Visit: Payer: BLUE CROSS/BLUE SHIELD | Admitting: Family Medicine

## 2016-11-06 ENCOUNTER — Ambulatory Visit: Payer: BLUE CROSS/BLUE SHIELD | Admitting: Adult Health

## 2016-11-07 ENCOUNTER — Ambulatory Visit (INDEPENDENT_AMBULATORY_CARE_PROVIDER_SITE_OTHER): Payer: BLUE CROSS/BLUE SHIELD | Admitting: Family Medicine

## 2016-11-07 ENCOUNTER — Encounter: Payer: Self-pay | Admitting: Family Medicine

## 2016-11-07 VITALS — BP 131/87 | HR 50 | Ht 65.0 in | Wt 147.0 lb

## 2016-11-07 DIAGNOSIS — F430 Acute stress reaction: Secondary | ICD-10-CM

## 2016-11-07 DIAGNOSIS — R030 Elevated blood-pressure reading, without diagnosis of hypertension: Secondary | ICD-10-CM

## 2016-11-07 DIAGNOSIS — M545 Low back pain: Secondary | ICD-10-CM | POA: Diagnosis not present

## 2016-11-07 DIAGNOSIS — G8929 Other chronic pain: Secondary | ICD-10-CM

## 2016-11-07 DIAGNOSIS — M5431 Sciatica, right side: Secondary | ICD-10-CM | POA: Diagnosis not present

## 2016-11-07 DIAGNOSIS — Z630 Problems in relationship with spouse or partner: Secondary | ICD-10-CM

## 2016-11-07 DIAGNOSIS — G4700 Insomnia, unspecified: Secondary | ICD-10-CM

## 2016-11-07 DIAGNOSIS — Z7189 Other specified counseling: Secondary | ICD-10-CM

## 2016-11-07 DIAGNOSIS — K219 Gastro-esophageal reflux disease without esophagitis: Secondary | ICD-10-CM | POA: Diagnosis not present

## 2016-11-07 DIAGNOSIS — R17 Unspecified jaundice: Secondary | ICD-10-CM

## 2016-11-07 MED ORDER — FLUOXETINE HCL 10 MG PO TABS: ORAL_TABLET | ORAL | 0 refills | Status: DC

## 2016-11-07 MED ORDER — OMEPRAZOLE 20 MG PO CPDR: 20.0000 mg | DELAYED_RELEASE_CAPSULE | Freq: Two times a day (BID) | ORAL | 1 refills | Status: DC

## 2016-11-07 MED ORDER — AMITRIPTYLINE HCL 25 MG PO TABS: ORAL_TABLET | ORAL | 1 refills | Status: DC

## 2016-11-07 NOTE — Assessment & Plan Note (Signed)
Start prozac today after discussion;

## 2016-11-07 NOTE — Progress Notes (Signed)
Impression and Recommendations:    1. Right sciatic nerve pain   2. Chronic low back pain, unspecified back pain laterality, with sciatica presence unspecified   3. Gastroesophageal reflux disease, esophagitis presence not specified   4. Elevated blood pressure reading without diagnosis of hypertension   5. Counseling on health promotion and disease prevention   6. Insomnia, unspecified type   7. Acute reaction to stress   8. Total bilirubin, elevated   9. Stress due to marital conflict     Stress due to marital conflict Start prozac today after discussion;    The patient was counseled, risk factors were discussed, anticipatory guidance given.  Meds ordered this encounter  Medications  . DISCONTD: omeprazole (PRILOSEC) 20 MG capsule    Sig: Take 1 capsule (20 mg total) by mouth 2 (two) times daily before a meal.    Dispense:  180 capsule    Refill:  1  . DISCONTD: amitriptyline (ELAVIL) 25 MG tablet    Sig: 2 daily at bedtime when necessary insomnia or pain    Dispense:  180 tablet    Refill:  1  . DISCONTD: FLUoxetine (PROZAC) 10 MG tablet    Sig: Use one half tablet daily for 1 week then increase to 1 tablet daily    Dispense:  90 tablet    Refill:  0     Discontinued Medications   AMITRIPTYLINE (ELAVIL) 25 MG TABLET    1-2 daily at bedtime when necessary pain   AMITRIPTYLINE (ELAVIL) 25 MG TABLET    2 daily at bedtime when necessary insomnia or pain   GABAPENTIN (NEURONTIN) 100 MG CAPSULE    one tab 3 times a day   OMEPRAZOLE (PRILOSEC) 20 MG CAPSULE    Take 1 capsule (20 mg total) by mouth daily.   OMEPRAZOLE (PRILOSEC) 20 MG CAPSULE    Take 1 capsule (20 mg total) by mouth 2 (two) times daily before a meal.    Gross side effects, risk and benefits, and alternatives of medications and treatment plan in general discussed with patient.  Patient is aware that all medications have potential side effects and we are unable to predict every side effect or drug-drug  interaction that may occur.   Patient will call with any questions prior to using medication if they have concerns.  Expresses verbal understanding and consents to current therapy and treatment regimen.  No barriers to understanding were identified.  Red flag symptoms and signs discussed in detail.  Patient expressed understanding regarding what to do in case of emergency\urgent symptoms  Please see AVS handed out to patient at the end of our visit for further patient instructions/ counseling done pertaining to today's office visit.   Return in about 4 weeks (around 12/05/2016).     Note: This document was prepared using Dragon voice recognition software and may include unintentional dictation errors.  Thomasene Lot 2:13 PM --------------------------------------------------------------------------------------------------------------------------------------------------------------------------------------------------------------------------------------------    Subjective:    CC:  Chief Complaint  Patient presents with  . Insomnia  . Cyst    on back  . Medication Management    refill amitriptyline  . Gastroesophageal Reflux    HPI: Steve [name] is a 46 y.o. male who presents to [facility] today for issues as discussed below.   Separated from wife since Jan-  Not sleeping well- always awakens at 2am hour--> not sleeping well      Wt Readings from Last 3 Encounters:  07/23/18 158 lb 9.6 oz (71.9 kg)  01/06/18 151 lb (68.5 kg)  07/15/17 151 lb 11.2 oz (68.8 kg)   BP Readings from Last 3 Encounters:  07/23/18 122/84  01/06/18 126/78  07/15/17 132/87   Pulse Readings from Last 3 Encounters:  07/23/18 77  01/06/18 72  07/15/17 71   BMI Readings from Last 3 Encounters:  07/23/18 25.60 kg/m  01/06/18 24.37 kg/m  07/15/17 24.49 kg/m     Patient Care Team    Relationship Specialty Notifications Start End  Thomasene Lotpalski, Inette Doubrava, DO PCP -  General Family Medicine  01/09/16      Patient Active Problem List   Diagnosis Date Noted  . Generalized OA- esp hands 05/26/2017  . Insomnia 12/16/2016  . Tenosynovitis of fingers- bilateral second and third digits 12/16/2016  . Acute reaction to stress 12/16/2016  . Total bilirubin, elevated 11/07/2016  . Stress due to marital conflict 02/02/2016  . Counseling on health promotion and disease prevention 02/02/2016  . Elevated blood pressure reading without diagnosis of hypertension 02/02/2016  . GERD (gastroesophageal reflux disease) 01/12/2016  . H/O motion sickness/ seasickness 01/12/2016  . Sciatic nerve pain- R 01/09/2016  . Chronic lower back pain 01/09/2016  . Right lumbar radiculopathy 01/09/2016  . h/o Left ACL tear- s/p repair. 12/08/2014    Past Medical history, Surgical history, Family history, Social history, Allergies and Medications have been entered into the medical record, reviewed and changed as needed.    Current Meds  Medication Sig  . ibuprofen (ADVIL,MOTRIN) 200 MG tablet Take 200 mg by mouth every 6 (six) hours as needed. Takes 4 tablets as needed  . [DISCONTINUED] amitriptyline (ELAVIL) 25 MG tablet 1-2 daily at bedtime when necessary pain  . [DISCONTINUED] amitriptyline (ELAVIL) 25 MG tablet 2 daily at bedtime when necessary insomnia or pain  . [DISCONTINUED] gabapentin (NEURONTIN) 100 MG capsule one tab 3 times a day  . [DISCONTINUED] omeprazole (PRILOSEC) 20 MG capsule Take 1 capsule (20 mg total) by mouth daily.  . [DISCONTINUED] omeprazole (PRILOSEC) 20 MG capsule Take 1 capsule (20 mg total) by mouth 2 (two) times daily before a meal.    Allergies:  Allergies  Allergen Reactions  . Oxycodone Nausea And Vomiting     Review of Systems: General:   Denies fever, chills, unexplained weight loss.  Optho/Auditory:   Denies visual changes, blurred vision/LOV Respiratory:   Denies wheeze, DOE more than baseline levels.  Cardiovascular:   Denies chest  pain, palpitations, new onset peripheral edema  Gastrointestinal:   Denies nausea, vomiting, diarrhea, abd pain.  Genitourinary: Denies dysuria, freq/ urgency, flank pain or discharge from genitals.  Endocrine:     Denies hot or cold intolerance, polyuria, polydipsia. Musculoskeletal:   Denies unexplained myalgias, joint swelling, unexplained arthralgias, gait problems.  Skin:  Denies new onset rash, suspicious lesions Neurological:     Denies dizziness, unexplained weakness, numbness  Psychiatric/Behavioral:   Denies mood changes, suicidal or homicidal ideations, hallucinations    Objective:   Blood pressure 131/87, pulse (!) 50, height 5\' 5"  (1.651 m), weight 147 lb (66.7 kg), SpO2 98 %. Body mass index is 24.46 kg/m. General:  Well Developed, well nourished, appropriate for stated age.  Neuro:  Alert and oriented,  extra-ocular muscles intact  HEENT:  Normocephalic, atraumatic, neck supple, no carotid bruits appreciated  Skin:  no gross rash, warm, pink. Cardiac:  RRR, S1 S2 Respiratory:  ECTA B/L and A/P, Not using accessory muscles, speaking in full sentences- unlabored. Vascular:  Ext warm, no cyanosis apprec.; cap RF less 2 sec. Psych:  No HI/SI, judgement and insight good, Euthymic mood. Full Affect.

## 2016-11-07 NOTE — Patient Instructions (Signed)
  What is Chronic Stress Syndrome, Symptoms & Ways to Deal With it   What is Chronic Stress Syndrome?  Chronic Stress Syndrome is something which can now be called as a medical condition due to the amount of stress an individual is going through these days. Chronic Stress Syndrome causes the body and mind to shutdown and the person has no control over himself or herself. Due to the demands of modern day life and the hardship throughout day and night takes its toll over a period of time and the body and brain starts demanding rest and a break. This leads to certain symptoms where your performance level starts to dip at work, you become irritable both at work and at home, you may stop enjoying activities you previously liked, you may become depressed, you may get angry for even small things. Chronic Stress Syndrome can significantly impact your quality life. Thus it is important understand the symptoms of Chronic Stress Syndrome and react accordingly in order to cope up with it.  It is important to note here that a balanced work-home equation should be drawn to cut down symptoms of Chronic Stress Syndrome. Minor stressors can be overcome by the body's inbuilt stress response but when there is unending stress for a long period of time then an external help is required to ease the stress.  Chronic Stress Syndrome can physically and psychologically drain you over a period of time. For such cases stress management is the best way to cope up with Chronic Stress Syndrome. If Chronic Stress Syndrome is not treated then it may result in many health hazards like anxiety, muscle pain, insomnia, and high blood pressure along with a compromised immune system leading to frequent infections and missed days from work.    What are the Symptoms of Chronic Stress Syndrome?   The symptoms of Chronic Stress Syndrome are variable and range from generalized symptoms to emotional symptoms along with behavioral and  cognitive symptoms. Some of these symptoms have been delineated below:  Generalized Symptoms of Chronic Stress Syndrome are: Anxiety Depression Social isolation Headache Abdominal pain Lack of sleep Back pain Difficulty in concentrating Hypertension Hemorrhoids Varicose veins Panic attacks/ Panic disorder Cardiovascular diseases.   Some of the Emotional Symptoms of Chronic Stress Syndrome are: To become easily agitated, moody and frustrated Feeling overwhelmed which makes you feel like you are losing control. Having difficulty relaxing and have a peaceful mind Having low self esteem Feeling lonely Feeling worthless Feeling depressed Avoiding social environment.   Some of the Physical Symptoms of Chronic Stress Syndrome are: Headaches Lethargy Alternating diarrhea and constipation Nausea Muscles aches and pains Insomnia Rapid heartbeat and chest pain Infections and frequent colds Decreased libido Nervousness and shaking Tinnitus Sweaty palms Dry mouth Clenched jaw.  Some of the Cognitive Symptoms of Chronic Stress Syndrome are: Constant worrying Racing thoughts Disorganization and forgetfulness Inability to focus Poor judgment Abundance of negativity.  Some of the Behavioral Symptoms of Chronic Stress Syndrome are: Changes in appetite with less desire to eat Avoiding responsibilities Indulgence in alcohol or recreational drug use Increased nail biting and being fidgety Ways to Deal With Chronic Stress Syndrome    Chronic Stress Syndrome is not something which cannot be addressed. A bit of effort from your side in the form of lifestyle modifications, a little bit of exercise, a balanced work life equation can do wonders and help you get rid of Chronic Stress Syndrome.  Get Proper Sleep: It has been proved that Chronic   Stress Syndrome causes loss of sleep where an individual may not even be able to sleep for days unending. This may result in the  individual feeling lethargic and unable to focus at work the following morning. This may lead to decreased performance at work. Thus, it is important to have a good sleep-wake cycle. For this, try and not drink any caffeinated beverage about four hours prior to going to sleep, as caffeine pumps up the adrenaline and causes you to stay awake resulting ultimately in Chronic Stress Syndrome.  Avoid Alcohol and Drugs: Another way to get rid of Chronic Stress Syndrome is lifestyle modifications. Stay away from alcohol and other recreational drugs. Take Short Frequent Breaks at Work: Try to take frequent breaks from work and do not work continuously. Try and manage your work in such a way that you even meet your deadline and come home on time for a happy dinner with family. A good time spent with family and kids does wonders in not only dealing with Chronic Stress Syndrome but also preventing it.  Become Physically Active: Another step towards getting rid of Chronic Stress Syndrome is physical activity. If you do not have time to spend at the gym then at least try and go for daily walks for about half an hour a day which not only keeps the stress away but also is good for your overall health. Physical activity leads to production of endorphins which will make you feel relaxed and feel good.  Healthy Diet Can Help You Deal With Chronic Stress Syndrome: Have a balanced and healthy diet is another step towards a stress free life and keeping Chronic Stress Syndrome at bay. If time is a constraint then you can try eating three small meals a day. Try and avoid fast foods and take foods which are healthy and rich in proteins, fiber, and carbohydrates to boost your energy system.  Music Can Soothe Your Mind: Light music is one of the best and most effective relaxation techniques that one can try to overcome stress. It has shown to calm down the mind and take you away from all the stressors that you may be having. These  days it is also being used as a therapy in some institutes for overcoming stress. It is important here to discuss the importance of a good social support system for patients with Chronic Stress Syndrome, as a good social support framework can do wonders in taking the stress away from the patient and overcoming Chronic Stress Syndrome.  Meditation Can Help You Deal With Chronic Stress Syndrome Effectively: Meditation and yoga has also shown to be quite effective in relaxing the mind and coping up with Chronic Stress Syndrome   In cases where these measures are not helpful, then it is time for you to consult with a skilled psychologist or a psychiatrist for potential therapies or medications to control the stress response.   The psychologist can help you with a variety of steps for coping up with Chronic Stress Syndrome. Relaxation techniques and behavioral therapy are some of the methods employed by psychologists. In some cases, medications can also be given to help relax the patient.  Since Chronic Stress Syndrome is both emotionally and physically draining for the patient and it also adversely affects the family life of the patient hence it is important for the patient to recognize the condition and taking steps to cope up with it. Escaping measures like alcohol and drug use are of no help as they only   aggravate the condition apart from their other health hazards. If this condition is ignored or left untreated it can lead to various medical conditions like anxiety and depression and various other medical conditions.  Last but not least, smile as often as you can as it is the best gift that you can give to someone. The best way to stay relaxed is to have a good smile, exercise daily, spend time with your family, meditation and if required consultation with a good psychologist so that you can live a stress free life and overcome the symptoms of Chronic Stress Syndrome. 

## 2016-12-16 ENCOUNTER — Ambulatory Visit (INDEPENDENT_AMBULATORY_CARE_PROVIDER_SITE_OTHER): Payer: BLUE CROSS/BLUE SHIELD | Admitting: Family Medicine

## 2016-12-16 ENCOUNTER — Encounter: Payer: Self-pay | Admitting: Family Medicine

## 2016-12-16 VITALS — BP 127/83 | HR 55 | Ht 65.0 in | Wt 140.9 lb

## 2016-12-16 DIAGNOSIS — F430 Acute stress reaction: Secondary | ICD-10-CM | POA: Diagnosis not present

## 2016-12-16 DIAGNOSIS — Z630 Problems in relationship with spouse or partner: Secondary | ICD-10-CM | POA: Diagnosis not present

## 2016-12-16 DIAGNOSIS — G4700 Insomnia, unspecified: Secondary | ICD-10-CM | POA: Diagnosis not present

## 2016-12-16 DIAGNOSIS — K219 Gastro-esophageal reflux disease without esophagitis: Secondary | ICD-10-CM

## 2016-12-16 DIAGNOSIS — M659 Synovitis and tenosynovitis, unspecified: Secondary | ICD-10-CM | POA: Insufficient documentation

## 2016-12-16 DIAGNOSIS — Z7189 Other specified counseling: Secondary | ICD-10-CM

## 2016-12-16 MED ORDER — MELOXICAM 15 MG PO TABS: 15.0000 mg | ORAL_TABLET | Freq: Every day | ORAL | 0 refills | Status: DC

## 2016-12-16 MED ORDER — FLUOXETINE HCL 20 MG PO TABS: ORAL_TABLET | ORAL | 1 refills | Status: DC

## 2016-12-16 NOTE — Patient Instructions (Addendum)
Go to your insurance website and see who takes your insurance, they will have providers and psychologists in the area.  Call them up and see what the co-pay is etc.  Please see the handout I gave you on trigger finger.  Please consider night splints and avoiding repetitive activity with those hands.  See the prescription of meloxicam I gave you    Behavioral Health/Psychiatry Referrals   [facility] Caralyn Guileavid Gutterman, PhD [address], [place] [phone]  [facility] [address], [address], [place] [phone]  Heloise BeechamAlicia Brown-Licensed Professional Counselor Counseling and [employer] [facility] [employer] [phone]  [facility] MacKenzie-Substance abuse [facility] Taylor-Clinical Manager [address], [place] [phone]  [facility] [address], [place] [phone] Eliott NineMichie Dew, PhD [place] ScrapeStuart Hunt, PhD Hollace Kinnierlair Huprich, LCSW Andrena MewsJennifer Sommer, PhD-child, adolescent and adults  [facility] [address], [place] [phone] Daun PeacockSara DeHart-Young, MS-child, adolescent and adults Madelaine EtienneKatherine [name], PhD-adolescent and adults   [facility] [address], [place] [phone] [facility] [address] [place] [phone]  [facility] [address], [place] [phone]  [facility] [address], [address], [place] [phone]  [facility] [address], [place] [phone]  [facility] [address], [place] [phone]  [facility] [address], [address], [place] [phone]

## 2016-12-16 NOTE — Progress Notes (Signed)
Impression and Recommendations:    1. Acute reaction to stress   2. Stress due to marital conflict   3. Tenosynovitis of fingers- bilateral second and third digits   4. Gastroesophageal reflux disease, esophagitis presence not specified   5. Insomnia, unspecified type   6. Counseling on health promotion and disease prevention    1. - Handouts provided, night splints, avoid repetitive movements, meloxicam when necessary.  Consider steroid injections and we can send him to sports med.  Patient declines today.  2. - We'll increase Prozac from 10 to 20 mg today.  He will let us know sooner than planned follow-up if he has any concerns or questions.  I gave him handouts of counselors in the area and I highly recommend he go personally as well as possibly with his wife who he is now separated from  3. - Continue reflux meds daily.  Avoidance of trigger foods  4.  - Patient has not been taking his Elavil nightly.  Recommend he take it around the clock every night.  Going from 25 up to 50 mg has helped with pain as well as sleep difficulties.   The patient was counseled, risk factors were discussed, anticipatory guidance given.   New Prescriptions   MELOXICAM (MOBIC) 15 MG TABLET    Take 1 tablet (15 mg total) by mouth daily.   Gross side effects, risk and benefits, and alternatives of medications and treatment plan in general discussed with patient.  Patient is aware that all medications have potential side effects and we are unable to predict every side effect or drug-drug interaction that may occur.   Patient will call with any questions prior to using medication if they have concerns.  Expresses verbal understanding and consents to current therapy and treatment regimen.  No barriers to understanding were identified.  Red flag symptoms and signs discussed in detail.  Patient expressed understanding regarding what to do in case of emergency\urgent symptoms  Pt was in the office today  for 40+ minutes, with over 50% time spent in face to face counseling of patient regarding his marital stress, and interpersonal tensions with his wife.  We discussed the fact that he is not eating well and has lost approximately 10 pounds from a year ago and 7 pounds from when last seen.  We discussed importance of going to a counselor and working on relationship with wife, trying to only focus on his relationship and not opinions of others.  We discussed treatment plans of these medical conditions including medicine management and lifestyle modification, strategies to improve health and well being. SEE ABOVE FOR DETAILS  Please see AVS handed out to patient at the end of our visit for further patient instructions/ counseling done pertaining to today's office visit.   Return for 4-25mo for follow-up of increase in Prozac, and tenosynovitis .     Note: This document was prepared using Dragon voice recognition software and may include unintentional dictation errors.  Thomasene Lot 5:46 PM --------------------------------------------------------------------------------------------------------------------------------------------------------------------------------------------------------------------------------------------    Subjective:    CC:  Chief Complaint  Patient presents with  . Follow-up    patient states that he is doing well with medications  . Hand Pain    bilat hand pain - middle finger x months     HPI: Steve [name] is a 46 y.o. male who presents to [facility] today for issues as discussed below.   prozac- taking it, Tolerating well.  Notices little difference in the medicines.  Having a lot of stress with his wife.  They have been talking more and trying to work on their relationship a little.  Patient having bouts of excess worry and anxiety over whether or not she's telling him the truth, still seeing another guy etc.  Emotionally, patient  appears more stable and calmer in the office today versus last office visit.  Elavil- taking 2 nitely now.  Helping him with sleep and helps with pain.   gerd-  Taking meds daily- sx well controlled.  Has not gotten worse since he's been more stressed.  But now, he is taking his medicines daily, unlike before.   Acute concerns: Patient also complains of some catching and stiffness of his middle fingers bilaterally as well as his second fingers bilaterally.  This is been going on for a couple months.  It gets worse at the end of the day after he's been active and using his hands doing repetitive movements at work.  He ihas a physical laborer-type job.  Never had steroid injections in the past never had this issue in the past.  He is wondering what he can do about it.   Wt Readings from Last 3 Encounters:  12/16/16 140 lb 14.4 oz (63.9 kg)  11/07/16 147 lb (66.7 kg)  03/21/16 150 lb 8 oz (68.3 kg)   BP Readings from Last 3 Encounters:  12/16/16 127/83  11/07/16 131/87  03/21/16 115/80   Pulse Readings from Last 3 Encounters:  12/16/16 (!) 55  11/07/16 (!) 50  03/21/16 71   BMI Readings from Last 3 Encounters:  12/16/16 23.45 kg/m  11/07/16 24.46 kg/m  03/21/16 25.04 kg/m     Patient Care Team    Relationship Specialty Notifications Start End  Thomasene Lot, DO PCP - General Family Medicine  01/09/16      Patient Active Problem List   Diagnosis Date Noted  . Insomnia 12/16/2016  . Tenosynovitis of fingers- bilateral second and third digits 12/16/2016  . Acute reaction to stress 12/16/2016  . Total bilirubin, elevated 11/07/2016  . Stress due to marital conflict 02/02/2016  . Counseling on health promotion and disease prevention 02/02/2016  . Elevated blood pressure reading without diagnosis of hypertension 02/02/2016  . GERD (gastroesophageal reflux disease) 01/12/2016  . H/O motion sickness/ seasickness 01/12/2016  . Sciatic nerve pain- R 01/09/2016  . Chronic lower  back pain 01/09/2016  . Right lumbar radiculopathy 01/09/2016  . h/o Left ACL tear- s/p repair. 12/08/2014    Past Medical history, Surgical history, Family history, Social history, Allergies and Medications have been entered into the medical record, reviewed and changed as needed.    Current Meds  Medication Sig  . amitriptyline (ELAVIL) 25 MG tablet 2 daily at bedtime when necessary insomnia or pain  . FLUoxetine (PROZAC) 20 MG tablet 1 tablet daily  . ibuprofen (ADVIL,MOTRIN) 200 MG tablet Take 200 mg by mouth every 6 (six) hours as needed. Takes 4 tablets as needed  . omeprazole (PRILOSEC) 20 MG capsule Take 1 capsule (20 mg total) by mouth 2 (two) times daily before a meal.  . [DISCONTINUED] FLUoxetine (PROZAC) 10 MG tablet Use one half tablet daily for 1 week then increase to 1 tablet daily    Allergies:  Allergies  Allergen Reactions  . Oxycodone Nausea And Vomiting     Review of Systems: General:   Denies fever, chills, unexplained weight loss.  Optho/Auditory:   Denies visual changes, blurred vision/LOV  Respiratory:   Denies wheeze, DOE more than baseline levels.  Cardiovascular:   Denies chest pain, palpitations, new onset peripheral edema  Gastrointestinal:   Denies nausea, vomiting, diarrhea, abd pain.  Genitourinary: Denies dysuria, freq/ urgency, flank pain or discharge from genitals.  Endocrine:     Denies hot or cold intolerance, polyuria, polydipsia. Musculoskeletal:   Denies unexplained myalgias, + joint swelling- fingers, + unexplained arthralgias, No gait problems.  Skin:  Denies new onset rash, suspicious lesions Neurological:     Denies dizziness, unexplained weakness, numbness  Psychiatric/Behavioral:   Denies suicidal or homicidal ideations, hallucinations; + stress and anxiety as well as depressed mood at times    Objective:   Blood pressure 127/83, pulse (!) 55, height 5\' 5"  (1.651 m), weight 140 lb 14.4 oz (63.9 kg). Body mass index is 23.45  kg/m. General:  Well Developed, well nourished, appropriate for stated age.  Neuro:  Alert and oriented,  extra-ocular muscles intact  HEENT:  Normocephalic, atraumatic, neck supple, no carotid bruits appreciated  Skin:  no gross rash, warm, pink. Cardiac:  RRR, S1 S2 Respiratory:  ECTA B/L and A/P, Not using accessory muscles, speaking in full sentences- unlabored. M-Sk: PIP joints of second and third fingers bilaterally enlarged versus other fingers, no catching or popping appreciated.  Full range of motion fingers, capillary refill less than 2 seconds.  Neurologically intact distally. Vascular:  Ext warm, no cyanosis apprec Psych:  No HI/SI, judgement and insight good, Euthymic mood. Full Affect.

## 2016-12-18 ENCOUNTER — Telehealth: Payer: Self-pay | Admitting: Family Medicine

## 2016-12-18 NOTE — Telephone Encounter (Signed)
Called the patient no answer, unable to leave message due to VM being full.  MPulliam, CMA/RT(R)

## 2016-12-18 NOTE — Telephone Encounter (Signed)
Patient called and said he thinks he is having some reactions to the new meds. He said this morning he experienced an episode of extreme sweating and shaking, he is concerned and wants to speak with someone clinical about it.

## 2016-12-18 NOTE — Telephone Encounter (Signed)
Spoke to patient he is taking both medications at night and had not ate this morning.  Patient states he is feeling tired and off a little kind of like he had been drinking.  Spoke to Dr. Sharee Holsterpalski about this issues, per her advise told patient to not take the Prozac tonight only take the Elavil then start the Prozac tomorrow morning but to only take 1 tablet about 30 minutes after a good breakfast.  Patient is to start back with the 2 tablets of Prozac Friday morning after eating.  Patient expressed understanding and will call back if this does not help.  MPulliam, CMA/RT(R)

## 2016-12-30 ENCOUNTER — Other Ambulatory Visit: Payer: Self-pay | Admitting: Family Medicine

## 2017-03-28 ENCOUNTER — Other Ambulatory Visit: Payer: Self-pay | Admitting: Family Medicine

## 2017-03-28 DIAGNOSIS — M659 Synovitis and tenosynovitis, unspecified: Secondary | ICD-10-CM

## 2017-03-28 NOTE — Telephone Encounter (Signed)
Please review refill request and authorize if appropriate.  It is unclear in the last OV notes if this refill would be appropriate.  Tiajuana Amass. Nelson, CMA

## 2017-05-26 ENCOUNTER — Ambulatory Visit (INDEPENDENT_AMBULATORY_CARE_PROVIDER_SITE_OTHER): Payer: BLUE CROSS/BLUE SHIELD | Admitting: Family Medicine

## 2017-05-26 ENCOUNTER — Other Ambulatory Visit: Payer: Self-pay | Admitting: Family Medicine

## 2017-05-26 ENCOUNTER — Encounter: Payer: Self-pay | Admitting: Family Medicine

## 2017-05-26 VITALS — BP 128/81 | HR 63 | Ht 66.0 in | Wt 146.3 lb

## 2017-05-26 DIAGNOSIS — G4700 Insomnia, unspecified: Secondary | ICD-10-CM | POA: Diagnosis not present

## 2017-05-26 DIAGNOSIS — K219 Gastro-esophageal reflux disease without esophagitis: Secondary | ICD-10-CM | POA: Diagnosis not present

## 2017-05-26 DIAGNOSIS — M659 Synovitis and tenosynovitis, unspecified: Secondary | ICD-10-CM | POA: Diagnosis not present

## 2017-05-26 DIAGNOSIS — M159 Polyosteoarthritis, unspecified: Secondary | ICD-10-CM | POA: Insufficient documentation

## 2017-05-26 DIAGNOSIS — Z630 Problems in relationship with spouse or partner: Secondary | ICD-10-CM | POA: Diagnosis not present

## 2017-05-26 DIAGNOSIS — M5431 Sciatica, right side: Secondary | ICD-10-CM

## 2017-05-26 MED ORDER — RANITIDINE HCL 300 MG PO TABS: 300.0000 mg | ORAL_TABLET | Freq: Two times a day (BID) | ORAL | 1 refills | Status: DC

## 2017-05-26 MED ORDER — MELOXICAM 15 MG PO TABS: 15.0000 mg | ORAL_TABLET | Freq: Every day | ORAL | 1 refills | Status: DC

## 2017-05-26 MED ORDER — OMEPRAZOLE 20 MG PO CPDR: 20.0000 mg | DELAYED_RELEASE_CAPSULE | Freq: Every day | ORAL | 1 refills | Status: DC

## 2017-05-26 MED ORDER — GABAPENTIN 100 MG PO CAPS: 100.0000 mg | ORAL_CAPSULE | Freq: Three times a day (TID) | ORAL | 1 refills | Status: DC

## 2017-05-26 MED ORDER — AMITRIPTYLINE HCL 25 MG PO TABS: ORAL_TABLET | ORAL | 1 refills | Status: DC

## 2017-05-26 NOTE — Progress Notes (Signed)
Impression and Recommendations:    1. Generalized OA- esp hands   2. Tenosynovitis of fingers- bilateral second and third digits   3. Gastroesophageal reflux disease, esophagitis presence not specified   4. Right sciatic nerve pain   5. Insomnia, unspecified type   6. Stress due to marital conflict- improved    Meds ordered this encounter  Medications  . ranitidine (ZANTAC) 300 MG tablet    Sig: Take 1 tablet (300 mg total) by mouth 2 (two) times daily.    Dispense:  180 tablet    Refill:  1  . omeprazole (PRILOSEC) 20 MG capsule    Sig: Take 1 capsule (20 mg total) by mouth daily.    Dispense:  90 capsule    Refill:  1  . meloxicam (MOBIC) 15 MG tablet    Sig: Take 1 tablet (15 mg total) by mouth daily.    Dispense:  90 tablet    Refill:  1  . amitriptyline (ELAVIL) 25 MG tablet    Sig: 2 daily at bedtime when necessary insomnia or pain    Dispense:  180 tablet    Refill:  1  . gabapentin (NEURONTIN) 100 MG capsule    Sig: Take 1 capsule (100 mg total) by mouth 3 (three) times daily.    Dispense:  240 capsule    Refill:  1   Mood:  Prozac DC'd. Patent discontinued use and weaned off Prozac on his own. Patient is doing well at this time.   GERD: ranitidine (zantac) prescribed 0.5 tablet 2x daily, PRN, in addition to 20 mg omeprazole daily.  We did discuss changing head of bed to help with symptoms.  Left shoulder pain: Recommended to elevate bed to alleviate shoulder pain-which is worse at night.  - Advised the patient to [facility] for possible joint injection if not improved with conservative management.  - Discussed and recommended use of ice for 15-20 minutes, 3-4 times/day.  Insomnia: -Symptoms have improved since taking 2 Elavil nightly.  He sleeps well on this med.  Feels he still needs this medicine.  No troubles falling asleep and does wake feeling rested  Back pain/lumbar radiculopathy:   Pt has been  taking gabapentin and elavil (2 at night) with no symptoms or adverse side effects.  -Advised to continue this medication.   Muscle spasms:   -For any muscle spasms he has, advised ice cup massage followed by heat for muscle spasms.  Gross side effects, risk and benefits, and alternatives of medications and treatment plan in general discussed with patient.  Patient is aware that all medications have potential side effects and we are unable to predict every side effect or drug-drug interaction that may occur.   Patient will call with any questions prior to using medication if they have concerns.  Expresses verbal understanding and consents to current therapy and treatment regimen.  No barriers to understanding were identified.  Red flag symptoms and signs discussed in detail.  Patient expressed understanding regarding what to do in case of emergency\urgent symptoms     Please see AVS handed out to patient at the end of our visit for further patient instructions/ counseling done pertaining to today's office visit.   Return for Near future for fasting blood work and complete physical exam..    Note: This note was prepared with assistance of Conservation officer, historic buildingsDragon voice recognition software. Occasional wrong-word or sound-a-like substitutions may have occurred due to the  inherent limitations of voice recognition software.  This document serves as a record of services personally performed by Thomasene Loteborah Paije Goodhart, MD. It was created on her behalf by Thelma BargeNick Cochran, a trained medical scribe. The creation of this record is based on the scribe's personal observations and the provider's statements to them.   - I have reviewed the above documentation for accuracy and completeness, and I agree with the above.   Thomasene LotDeborah Gearldine Looney 05/26/17 7:16  PM  --------------------------------------------------------------------------------------------------------------------------------------------------------------------------------------------------------------------------------------------    Subjective:    CC: Medication followup Chief Complaint  Patient presents with  . Follow-up    HPI: Steve [name] is a 46 y.o. male who [facility] for issues as discussed below.  Mood:  Pt was previously prescribed Prozac due to marital problems with his ex wife but notes his relations with her have improved-they have split and go on their own ways and he has since weaned himself off of the medications.  He denies any adverse reactions or symptoms as a result. -  He reports he lost his grandmother last month and grandfather last year (both due to cancer, one liver and one pancreas), so this is his first holiday season without his grandparents.  - He does note his daughter is having behavioral problems at school. He has half custody with his daughter.   New Sexual relations: He also notes he is having sex about once a month and always uses protection.   OA and tenosynovitis hands bilaterally:  Meloxicam is working for his muscle soreness in his back. He is tolerating meloxicam well.   Reflux:  His acid reflux symptoms remain and he takes Prilosec daily with relief.  He states his reflux has improved by lying on his back or left side to sleep at night, but notes he has a sharp, stinging pain in his left shoulder as a result of sleeping on his left shoulder.  Left shoulder pain: -This is new.  He did not have a fall or injury.  It is a deep ache in the joint.  It especially hurts after heavy workouts and lifting heavy weights.  He is able to perform all ADLs however.  Pain is worse when he is trying to sleep at night when he is lying in bed.  He has not iced it nor taken any pain meds for this.  He just  mentions it today and wondering what he can do.  Pt works out 3 times a week and runs 5 miles/day. Pt takes multivitamin daily.    Problem  Generalized OA- esp hands     Wt Readings from Last 3 Encounters:  05/26/17 146 lb 4.8 oz (66.4 kg)  12/16/16 140 lb 14.4 oz (63.9 kg)  11/07/16 147 lb (66.7 kg)   BP Readings from Last 3 Encounters:  05/26/17 128/81  12/16/16 127/83  11/07/16 131/87   Pulse Readings from Last 3 Encounters:  05/26/17 63  12/16/16 (!) 55  11/07/16 (!) 50   BMI Readings from Last 3 Encounters:  05/26/17 23.61 kg/m  12/16/16 23.45 kg/m  11/07/16 24.46 kg/m     Patient Care Team    Relationship Specialty Notifications Start End  Thomasene Lotpalski, Shawne Bulow, DO PCP - General Family Medicine  01/09/16      Patient Active Problem List   Diagnosis Date Noted  . Generalized OA- esp hands 05/26/2017  . Insomnia 12/16/2016  . Tenosynovitis of fingers- bilateral second and third digits 12/16/2016  . Acute reaction  to stress 12/16/2016  . Total bilirubin, elevated 11/07/2016  . Stress due to marital conflict 02/02/2016  . Counseling on health promotion and disease prevention 02/02/2016  . Elevated blood pressure reading without diagnosis of hypertension 02/02/2016  . GERD (gastroesophageal reflux disease) 01/12/2016  . H/O motion sickness/ seasickness 01/12/2016  . Sciatic nerve pain- R 01/09/2016  . Chronic lower back pain 01/09/2016  . Right lumbar radiculopathy 01/09/2016  . h/o Left ACL tear- s/p repair. 12/08/2014    Past Medical history, Surgical history, Family history, Social history, Allergies and Medications have been entered into the medical record, reviewed and changed as needed.    Current Meds  Medication Sig  . amitriptyline (ELAVIL) 25 MG tablet 2 daily at bedtime when necessary insomnia or pain  . gabapentin (NEURONTIN) 100 MG capsule Take 1 capsule (100 mg total) by mouth 3 (three) times daily.  Marland Kitchen ibuprofen (ADVIL,MOTRIN) 200 MG tablet  Take 200 mg by mouth every 6 (six) hours as needed. Takes 4 tablets as needed  . meloxicam (MOBIC) 15 MG tablet Take 1 tablet (15 mg total) by mouth daily.  Marland Kitchen omeprazole (PRILOSEC) 20 MG capsule Take 1 capsule (20 mg total) by mouth daily.  . [DISCONTINUED] amitriptyline (ELAVIL) 25 MG tablet 2 daily at bedtime when necessary insomnia or pain  . [DISCONTINUED] gabapentin (NEURONTIN) 100 MG capsule TAKE ONE CAPSULE BY MOUTH 3 TIMES A DAY  . [DISCONTINUED] meloxicam (MOBIC) 15 MG tablet TAKE 1 TABLET BY MOUTH EVERY DAY  . [DISCONTINUED] omeprazole (PRILOSEC) 20 MG capsule Take 1 capsule (20 mg total) by mouth 2 (two) times daily before a meal.    Allergies:  Allergies  Allergen Reactions  . Oxycodone Nausea And Vomiting     Review of Systems: General:   Denies fever, chills, unexplained weight loss.  Optho/Auditory:   Denies visual changes, blurred vision/LOV Respiratory:   Denies wheeze, DOE more than baseline levels.  Cardiovascular:   Denies chest pain, palpitations, new onset peripheral edema  Gastrointestinal:   Denies nausea, vomiting, diarrhea, abd pain.  Genitourinary: Denies dysuria, freq/ urgency, flank pain or discharge from genitals.  Endocrine:     Denies hot or cold intolerance, polyuria, polydipsia. Musculoskeletal:   Denies unexplained myalgias, joint swelling, unexplained arthralgias, gait problems.  Skin:  Denies new onset rash, suspicious lesions Neurological:     Denies dizziness, unexplained weakness, numbness  Psychiatric/Behavioral:   Denies mood changes, suicidal or homicidal ideations, hallucinations    Objective:   Blood pressure 128/81, pulse 63, height 5\' 6"  (1.676 m), weight 146 lb 4.8 oz (66.4 kg), SpO2 99 %. Body mass index is 23.61 kg/m.   General:  Well Developed, well nourished, appropriate for stated age.  Neuro:  Alert and oriented,  extra-ocular muscles intact  HEENT:  Normocephalic, atraumatic, neck supple, no carotid bruits appreciated    Skin:  no gross rash, warm, pink. Cardiac:  RRR, S1 S2 Respiratory:  ECTA B/L and A/P, Not using accessory muscles, speaking in full sentences- unlabored. Vascular:  Ext warm, no cyanosis apprec.; cap RF less 2 sec. Psych:  No HI/SI, judgement and insight good, Euthymic mood. Full Affect. Musculoskeletal: paraspinal muscle spasm in t7 on the right

## 2017-05-26 NOTE — Patient Instructions (Addendum)
Take the ranitidine\Zantac 1/2 tablet or 150 mg twice daily as needed for reflux and heartburn.  Patient knows not to take the 300 twice daily as written  Please look into obtaining the automatic bed so you can elevate the head of your bed for your shoulder and for the reflux.  Please call orthopedics if the shoulder pain continues despite our conservative management to get an injection

## 2017-07-14 NOTE — Patient Instructions (Addendum)
Tinea Versicolor Tinea versicolor is a common fungal infection of the skin. It causes a rash that appears as light or dark patches on the skin. The rash most often occurs on the chest, back, neck, or upper arms. This condition is more common during warm weather. Other than affecting how your skin looks, tinea versicolor usually does not cause other problems. In most cases, the infection goes away in a few weeks with treatment. It may take a few months for the patches on your skin to clear up. What are the causes? Tinea versicolor occurs when a type of fungus that is normally present on the skin starts to overgrow. This fungus is a kind of yeast. The exact cause of the overgrowth is not known. This condition cannot be passed from one person to another (noncontagious). What increases the risk? This condition is more likely to develop when certain factors are present, such as:  Heat and humidity.  Sweating too much.  Hormone changes.  Oily skin.  A weak defense (immune) system.  What are the signs or symptoms? Symptoms of this condition may include:  A rash on your skin that is made up of light or dark patches. The rash may have: ? Patches of tan or pink spots on light skin. ? Patches of white or brown spots on dark skin. ? Patches of skin that do not tan. ? Well-marked edges. ? Scales on the discolored areas.  Mild itching.  How is this diagnosed? A health care provider can usually diagnose this condition by looking at your skin. During the exam, he or she may use ultraviolet light to help determine the extent of the infection. In some cases, a skin sample may be taken by scraping the rash. This sample will be viewed under a microscope to check for yeast overgrowth. How is this treated? Treatment for this condition may include:  -Use terbinafine\Lamisil cream twice daily for 8-12 weeks.  It is really important you keep your skin dry and avoid any known exposure such as towels from  the gym, or others with known tinea infection  Dandruff shampoo that is applied to the affected skin during showers or bathing.  Over-the-counter medicated skin cream, lotion, or soaps.  Prescription antifungal medicine in the form of skin cream or pills.  Medicine to help reduce itching.  Follow these instructions at home:  Take medicines only as directed by your health care provider.  Apply dandruff shampoo to the affected area if told to do so by your health care provider. You may be instructed to scrub the affected skin for several minutes each day.  Do not scratch the affected area of skin.  Avoid hot and humid conditions.  Do not use tanning booths.  Try to avoid sweating a lot. Contact a health care provider if:  Your symptoms get worse.  You have a fever.  You have redness, swelling, or pain at the site of your rash.  You have fluid, blood, or pus coming from your rash.  Your rash returns after treatment. This information is not intended to replace advice given to you by your health care provider. Make sure you discuss any questions you have with your health care provider. Document Released: 05/24/2000 Document Revised: 01/28/2016 Document Reviewed: 03/08/2014 Elsevier Interactive Patient Education  2018 [place] for Adults, Male A healthy lifestyle and preventive care can promote health and wellness. Preventive health guidelines for men include the following key practices:  A routine yearly physical is a good way to check with your health care provider about your health and preventative screening. It is a chance to share any concerns and updates on your health and to receive a thorough exam.  Visit your dentist for a routine exam and preventative care every 6 months. Brush your teeth twice a day and floss once a day. Good oral hygiene prevents tooth decay and gum disease.  The frequency of eye exams is based on your age, health,  family medical history, use of contact lenses, and other factors. Follow your health care provider's recommendations for frequency of eye exams.  Eat a healthy diet. Foods such as vegetables, fruits, whole grains, low-fat dairy products, and lean protein foods contain the nutrients you need without too many calories. Decrease your intake of foods high in solid fats, added sugars, and salt. Eat the right amount of calories for you.Get information about a proper diet from your health care provider, if necessary.  Regular physical exercise is one of the most important things you can do for your health. Most adults should get at least 150 minutes of moderate-intensity exercise (any activity that increases your heart rate and causes you to sweat) each week. In addition, most adults need muscle-strengthening exercises on 2 or more days a week.  Maintain a healthy weight. The body mass index (BMI) is a screening tool to identify possible weight problems. It provides an estimate of body fat based on height and weight. Your health care provider can find your BMI and can help you achieve or maintain a healthy weight.For adults 20 years and older:  A BMI below 18.5 is considered underweight.  A BMI of 18.5 to 24.9 is normal.  A BMI of 25 to 29.9 is considered overweight.  A BMI of 30 and above is considered obese.  Maintain normal blood lipids and cholesterol levels by exercising and minimizing your intake of saturated fat. Eat a balanced diet with plenty of fruit and vegetables. Blood tests for lipids and cholesterol should begin at age 52 and be repeated every 5 years. If your lipid or cholesterol levels are high, you are over 50, or you are at high risk for heart disease, you may need your cholesterol levels checked more frequently.Ongoing high lipid and cholesterol levels should be treated with medicines if diet and exercise are not working.  If you smoke, find out from your health care provider how to  quit. If you do not use tobacco, do not start.  Lung cancer screening is recommended for adults aged 72-80 years who are at high risk for developing lung cancer because of a history of smoking. A yearly low-dose CT scan of the lungs is recommended for people who have at least a 30-pack-year history of smoking and are a current smoker or have quit within the past 15 years. A pack year of smoking is smoking an average of 1 pack of cigarettes a day for 1 year (for example: 1 pack a day for 30 years or 2 packs a day for 15 years). Yearly screening should continue until the smoker has stopped smoking for at least 15 years. Yearly screening should be stopped for people who develop a health problem that would prevent them from having lung cancer treatment.  If you choose to drink alcohol, do not have more than 2 drinks per day. One drink is considered to be 12 ounces (355 mL) of beer, 5 ounces (148 mL) of wine, or 1.5 ounces (  44 mL) of liquor.  Avoid use of street drugs. Do not share needles with anyone. Ask for help if you need support or instructions about stopping the use of drugs.  High blood pressure causes heart disease and increases the risk of stroke. Your blood pressure should be checked at least every 1-2 years. Ongoing high blood pressure should be treated with medicines, if weight loss and exercise are not effective.  If you are 43-67 years old, ask your health care provider if you should take aspirin to prevent heart disease.  Diabetes screening is done by taking a blood sample to check your blood glucose level after you have not eaten for a certain period of time (fasting). If you are not overweight and you do not have risk factors for diabetes, you should be screened once every 3 years starting at age 2. If you are overweight or obese and you are 1-2 years of age, you should be screened for diabetes every year as part of your cardiovascular risk assessment.  Colorectal cancer can be detected  and often prevented. Most routine colorectal cancer screening begins at the age of 38 and continues through age 32. However, your health care provider may recommend screening at an earlier age if you have risk factors for colon cancer. On a yearly basis, your health care provider may provide home test kits to check for hidden blood in the stool. Use of a small camera at the end of a tube to directly examine the colon (sigmoidoscopy or colonoscopy) can detect the earliest forms of colorectal cancer. Talk to your health care provider about this at age 52, when routine screening begins. Direct exam of the colon should be repeated every 5-10 years through age 83, unless early forms of precancerous polyps or small growths are found.  People who are at an increased risk for hepatitis B should be screened for this virus. You are considered at high risk for hepatitis B if:  You were born in a country where hepatitis B occurs often. Talk with your health care provider about which countries are considered high risk.  Your parents were born in a high-risk country and you have not received a shot to protect against hepatitis B (hepatitis B vaccine).  You have HIV or AIDS.  You use needles to inject street drugs.  You live with, or have sex with, someone who has hepatitis B.  You are a man who has sex with other men (MSM).  You get hemodialysis treatment.  You take certain medicines for conditions such as cancer, organ transplantation, and autoimmune conditions.  Hepatitis C blood testing is recommended for all people born from [birth year] through 1965 and any individual with known risks for hepatitis C.  Practice safe sex. Use condoms and avoid high-risk sexual practices to reduce the spread of sexually transmitted infections (STIs). STIs include gonorrhea, chlamydia, syphilis, trichomonas, herpes, HPV, and human immunodeficiency virus (HIV). Herpes, HIV, and HPV are viral illnesses that have no cure. They can  result in disability, cancer, and death.  If you are a man who has sex with other men, you should be screened at least once per year for:  HIV.  Urethral, rectal, and pharyngeal infection of gonorrhea, chlamydia, or both.  If you are at risk of being infected with HIV, it is recommended that you take a prescription medicine daily to prevent HIV infection. This is called preexposure prophylaxis (PrEP). You are considered at risk if:  You are a man who has  sex with other men (MSM) and have other risk factors.  You are a heterosexual man, are sexually active, and are at increased risk for HIV infection.  You take drugs by injection.  You are sexually active with a partner who has HIV.  Talk with your health care provider about whether you are at high risk of being infected with HIV. If you choose to begin PrEP, you should first be tested for HIV. You should then be tested every 3 months for as long as you are taking PrEP.  A one-time screening for abdominal aortic aneurysm (AAA) and surgical repair of large AAAs by ultrasound are recommended for men ages 6 to 15 years who are current or former smokers.  Healthy men should no longer receive prostate-specific antigen (PSA) blood tests as part of routine cancer screening. Talk with your health care provider about prostate cancer screening.  Testicular cancer screening is not recommended for adult males who have no symptoms. Screening includes self-exam, a health care provider exam, and other screening tests. Consult with your health care provider about any symptoms you have or any concerns you have about testicular cancer.  Use sunscreen. Apply sunscreen liberally and repeatedly throughout the day. You should seek shade when your shadow is shorter than you. Protect yourself by wearing long sleeves, pants, a wide-brimmed hat, and sunglasses year round, whenever you are outdoors.  Once a month, do a whole-body skin exam, using a mirror to look  at the skin on your back. Tell your health care provider about new moles, moles that have irregular borders, moles that are larger than a pencil eraser, or moles that have changed in shape or color.  Stay current with required vaccines (immunizations).  Influenza vaccine. All adults should be immunized every year.  Tetanus, diphtheria, and acellular pertussis (Td, Tdap) vaccine. An adult who has not previously received Tdap or who does not know his vaccine status should receive 1 dose of Tdap. This initial dose should be followed by tetanus and diphtheria toxoids (Td) booster doses every 10 years. Adults with an unknown or incomplete history of completing a 3-dose immunization series with Td-containing vaccines should begin or complete a primary immunization series including a Tdap dose. Adults should receive a Td booster every 10 years.  Varicella vaccine. An adult without evidence of immunity to varicella should receive 2 doses or a second dose if he has previously received 1 dose.  Human papillomavirus (HPV) vaccine. Males aged 11-21 years who have not received the vaccine previously should receive the 3-dose series. Males aged 22-26 years may be immunized. Immunization is recommended through the age of 71 years for any male who has sex with males and did not get any or all doses earlier. Immunization is recommended for any person with an immunocompromised condition through the age of 60 years if he did not get any or all doses earlier. During the 3-dose series, the second dose should be obtained 4-8 weeks after the first dose. The third dose should be obtained 24 weeks after the first dose and 16 weeks after the second dose.  Zoster vaccine. One dose is recommended for adults aged 35 years or older unless certain conditions are present.  Measles, mumps, and rubella (MMR) vaccine. Adults born before [birth year] generally are considered immune to measles and mumps. Adults born in [birth year] or later should have 1  or more doses of MMR vaccine unless there is a contraindication to the vaccine or there is laboratory evidence of immunity  to each of the three diseases. A routine second dose of MMR vaccine should be obtained at least 28 days after the first dose for students attending postsecondary schools, health care workers, or international travelers. People who received inactivated measles vaccine or an unknown type of measles vaccine during 1963-1967 should receive 2 doses of MMR vaccine. People who received inactivated mumps vaccine or an unknown type of mumps vaccine before 1979 and are at high risk for mumps infection should consider immunization with 2 doses of MMR vaccine. Unvaccinated health care workers born before [birth year] who lack laboratory evidence of measles, mumps, or rubella immunity or laboratory confirmation of disease should consider measles and mumps immunization with 2 doses of MMR vaccine or rubella immunization with 1 dose of MMR vaccine.  Pneumococcal 13-valent conjugate (PCV13) vaccine. When indicated, a person who is uncertain of his immunization history and has no record of immunization should receive the PCV13 vaccine. All adults 87 years of age and older should receive this vaccine. An adult aged 2 years or older who has certain medical conditions and has not been previously immunized should receive 1 dose of PCV13 vaccine. This PCV13 should be followed with a dose of pneumococcal polysaccharide (PPSV23) vaccine. Adults who are at high risk for pneumococcal disease should obtain the PPSV23 vaccine at least 8 weeks after the dose of PCV13 vaccine. Adults older than 47 years of age who have normal immune system function should obtain the PPSV23 vaccine dose at least 1 year after the dose of PCV13 vaccine.  Pneumococcal polysaccharide (PPSV23) vaccine. When PCV13 is also indicated, PCV13 should be obtained first. All adults aged 74 years and older should be immunized. An adult younger than age 65  years who has certain medical conditions should be immunized. Any person who resides in a nursing home or long-term care facility should be immunized. An adult smoker should be immunized. People with an immunocompromised condition and certain other conditions should receive both PCV13 and PPSV23 vaccines. People with human immunodeficiency virus (HIV) infection should be immunized as soon as possible after diagnosis. Immunization during chemotherapy or radiation therapy should be avoided. Routine use of PPSV23 vaccine is not recommended for American Indians, [place] Natives, or people younger than 65 years unless there are medical conditions that require PPSV23 vaccine. When indicated, people who have unknown immunization and have no record of immunization should receive PPSV23 vaccine. One-time revaccination 5 years after the first dose of PPSV23 is recommended for people aged 19-64 years who have chronic kidney failure, nephrotic syndrome, asplenia, or immunocompromised conditions. People who received 1-2 doses of PPSV23 before age 79 years should receive another dose of PPSV23 vaccine at age 67 years or later if at least 5 years have passed since the previous dose. Doses of PPSV23 are not needed for people immunized with PPSV23 at or after age 46 years.  Meningococcal vaccine. Adults with asplenia or persistent complement component deficiencies should receive 2 doses of quadrivalent meningococcal conjugate (MenACWY-D) vaccine. The doses should be obtained at least 2 months apart. Microbiologists working with certain meningococcal bacteria, [employer] recruits, people at risk during an outbreak, and people who travel to or live in countries with a high rate of meningitis should be immunized. A first-year college student up through age 93 years who is living in a residence hall should receive a dose if he did not receive a dose on or after his 16th birthday. Adults who have certain high-risk conditions should  receive one or more doses  of vaccine.  Hepatitis A vaccine. Adults who wish to be protected from this disease, have chronic liver disease, work with hepatitis A-infected animals, work in hepatitis A research labs, or travel to or work in countries with a high rate of hepatitis A should be immunized. Adults who were previously unvaccinated and who anticipate close contact with an international adoptee during the first 60 days after arrival in the [place] from a country with a high rate of hepatitis A should be immunized.  Hepatitis B vaccine. Adults should be immunized if they wish to be protected from this disease, are under age 32 years and have diabetes, have chronic liver disease, have had more than one sex partner in the past 6 months, may be exposed to blood or other infectious body fluids, are household contacts or sex partners of hepatitis B positive people, are clients or workers in certain care facilities, or travel to or work in countries with a high rate of hepatitis B.  Haemophilus influenzae type b (Hib) vaccine. A previously unvaccinated person with asplenia or sickle cell disease or having a scheduled splenectomy should receive 1 dose of Hib vaccine. Regardless of previous immunization, a recipient of a hematopoietic stem cell transplant should receive a 3-dose series 6-12 months after his successful transplant. Hib vaccine is not recommended for adults with HIV infection. Preventive Service / Frequency Ages 58 to 67  Blood pressure check.** / Every 3-5 years.  Lipid and cholesterol check.** / Every 5 years beginning at age 36.  Hepatitis C blood test.** / For any individual with known risks for hepatitis C.  Skin self-exam. / Monthly.  Influenza vaccine. / Every year.  Tetanus, diphtheria, and acellular pertussis (Tdap, Td) vaccine.** / Consult your health care provider. 1 dose of Td every 10 years.  Varicella vaccine.** / Consult your health care provider.  HPV vaccine.  / 3 doses over 6 months, if 19 or younger.  Measles, mumps, rubella (MMR) vaccine.** / You need at least 1 dose of MMR if you were born in 1957 or later. You may also need a second dose.  Pneumococcal 13-valent conjugate (PCV13) vaccine.** / Consult your health care provider.  Pneumococcal polysaccharide (PPSV23) vaccine.** / 1 to 2 doses if you smoke cigarettes or if you have certain conditions.  Meningococcal vaccine.** / 1 dose if you are age 13 to 19 years and a Market researcher living in a residence hall, or have one of several medical conditions. You may also need additional booster doses.  Hepatitis A vaccine.** / Consult your health care provider.  Hepatitis B vaccine.** / Consult your health care provider.  Haemophilus influenzae type b (Hib) vaccine.** / Consult your health care provider. Ages 66 to 68  Blood pressure check.** / Every year.  Lipid and cholesterol check.** / Every 5 years beginning at age 8.  Lung cancer screening. / Every year if you are aged 61-80 years and have a 30-pack-year history of smoking and currently smoke or have quit within the past 15 years. Yearly screening is stopped once you have quit smoking for at least 15 years or develop a health problem that would prevent you from having lung cancer treatment.  Fecal occult blood test (FOBT) of stool. / Every year beginning at age 52 and continuing until age 54. You may not have to do this test if you get a colonoscopy every 10 years.  Flexible sigmoidoscopy** or colonoscopy.** / Every 5 years for a flexible sigmoidoscopy or every 10 years  for a colonoscopy beginning at age 10 and continuing until age 11.  Hepatitis C blood test.** / For all people born from [birth year] through 1965 and any individual with known risks for hepatitis C.  Skin self-exam. / Monthly.  Influenza vaccine. / Every year.  Tetanus, diphtheria, and acellular pertussis (Tdap/Td) vaccine.** / Consult your health care provider. 1  dose of Td every 10 years.  Varicella vaccine.** / Consult your health care provider.  Zoster vaccine.** / 1 dose for adults aged 76 years or older.  Measles, mumps, rubella (MMR) vaccine.** / You need at least 1 dose of MMR if you were born in 1957 or later. You may also need a second dose.  Pneumococcal 13-valent conjugate (PCV13) vaccine.** / Consult your health care provider.  Pneumococcal polysaccharide (PPSV23) vaccine.** / 1 to 2 doses if you smoke cigarettes or if you have certain conditions.  Meningococcal vaccine.** / Consult your health care provider.  Hepatitis A vaccine.** / Consult your health care provider.  Hepatitis B vaccine.** / Consult your health care provider.  Haemophilus influenzae type b (Hib) vaccine.** / Consult your health care provider. Ages 70 and over  Blood pressure check.** / Every year.  Lipid and cholesterol check.**/ Every 5 years beginning at age 30.  Lung cancer screening. / Every year if you are aged 87-80 years and have a 30-pack-year history of smoking and currently smoke or have quit within the past 15 years. Yearly screening is stopped once you have quit smoking for at least 15 years or develop a health problem that would prevent you from having lung cancer treatment.  Fecal occult blood test (FOBT) of stool. / Every year beginning at age 36 and continuing until age 90. You may not have to do this test if you get a colonoscopy every 10 years.  Flexible sigmoidoscopy** or colonoscopy.** / Every 5 years for a flexible sigmoidoscopy or every 10 years for a colonoscopy beginning at age 59 and continuing until age 38.  Hepatitis C blood test.** / For all people born from [birth year] through 1965 and any individual with known risks for hepatitis C.  Abdominal aortic aneurysm (AAA) screening.** / A one-time screening for ages 37 to 66 years who are current or former smokers.  Skin self-exam. / Monthly.  Influenza vaccine. / Every year.  Tetanus,  diphtheria, and acellular pertussis (Tdap/Td) vaccine.** / 1 dose of Td every 10 years.  Varicella vaccine.** / Consult your health care provider.  Zoster vaccine.** / 1 dose for adults aged 30 years or older.  Pneumococcal 13-valent conjugate (PCV13) vaccine.** / 1 dose for all adults aged 52 years and older.  Pneumococcal polysaccharide (PPSV23) vaccine.** / 1 dose for all adults aged 30 years and older.  Meningococcal vaccine.** / Consult your health care provider.  Hepatitis A vaccine.** / Consult your health care provider.  Hepatitis B vaccine.** / Consult your health care provider.  Haemophilus influenzae type b (Hib) vaccine.** / Consult your health care provider. **Family history and personal history of risk and conditions may change your health care provider's recommendations.   This information is not intended to replace advice given to you by your health care provider. Make sure you discuss any questions you have with your health care provider.   Document Released: 07/23/2001 Document Revised: 06/17/2014 Document Reviewed: 10/22/2010 Elsevier Interactive Patient Education [employer].

## 2017-07-15 ENCOUNTER — Ambulatory Visit (INDEPENDENT_AMBULATORY_CARE_PROVIDER_SITE_OTHER): Payer: BLUE CROSS/BLUE SHIELD | Admitting: Family Medicine

## 2017-07-15 ENCOUNTER — Encounter: Payer: Self-pay | Admitting: Family Medicine

## 2017-07-15 VITALS — BP 132/87 | HR 71 | Ht 66.0 in | Wt 151.7 lb

## 2017-07-15 DIAGNOSIS — Z1389 Encounter for screening for other disorder: Secondary | ICD-10-CM | POA: Diagnosis not present

## 2017-07-15 DIAGNOSIS — Z0000 Encounter for general adult medical examination without abnormal findings: Secondary | ICD-10-CM | POA: Diagnosis not present

## 2017-07-15 DIAGNOSIS — Z1211 Encounter for screening for malignant neoplasm of colon: Secondary | ICD-10-CM | POA: Diagnosis not present

## 2017-07-15 LAB — POC HEMOCCULT BLD/STL (OFFICE/1-CARD/DIAGNOSTIC): Fecal Occult Blood, POC: NEGATIVE

## 2017-07-15 NOTE — Progress Notes (Signed)
Male physical  Impression and Recommendations:    1. Encounter for wellness examination   2. Screening for multiple conditions   3. Encounter for screening fecal occult blood testing     1) Anticipatory Guidance: Discussed importance of wearing a seatbelt while driving, not texting while driving;   sunscreen when outside along with skin surveillance; eating a balanced and modest diet; physical activity at least 25 minutes per day or 150 min/ week moderate to intense activity.  2) Immunizations / Screenings / Labs:  All immunizations are up-to-date per recommendations or will be updated today. Patient is due for dental and vision screens which pt will schedule independently. Will obtain CBC, CMP, HgA1c, Lipid panel, TSH and vit D when fasting, if not already done recently.   3) Weight:  BMI meaning discussed with patient.  Patient understand his BMI is slightly elevated due to his muscle mass as muscle weighs more than adipose tissue.  He is in very good muscular shape especially for his age.  Hence, BMI is elevated due to health and wellness.   Improve nutrient density of diet through increasing intake of fruits and vegetables and decreasing saturated fats, white flour products and refined sugars.   -Continue going for yearly skin exams with your dermatologist. Recommended going every other year or sooner as recommended by dermatologist.  -Recommended going to the eye doctor every year or every other year for a dilated eye exam.  -obtain FBW today.   - HOB stool negative  Orders Placed This Encounter  Procedures  . Comprehensive metabolic panel  . Hemoglobin A1c  . Lipid panel  . TSH  . VITAMIN D 25 Hydroxy (Vit-D Deficiency, Fractures)  . CBC with Differential/Platelet  . POC Hemoccult Bld/Stl (1-Cd Office Dx)    Gross side effects, risk and benefits, and alternatives of medications discussed with patient.  Patient is aware that all medications have potential side effects  and we are unable to predict every side effect or drug-drug interaction that may occur.  Expresses verbal understanding and consents to current therapy plan and treatment regimen.  Please see AVS handed out to patient at the end of our visit for further patient instructions/ counseling done pertaining to today's office visit.  Follow-up preventative CPE in 1 year. Follow-up office visit pending lab work.  F/up sooner for chronic care management and/or prn.  This document serves as a record of services personally performed by Thomasene Lot, DO. It was created on her behalf by Thelma Barge, a trained medical scribe. The creation of this record is based on the scribe's personal observations and the provider's statements to them.   I have reviewed the above medical documentation for accuracy and completeness and I concur.  Thomasene Lot 07/15/17 8:58 AM   Subjective:    CC: CPE  HPI: Steve [name] is a 47 y.o. male who presents to [facility] today for a yearly health maintenance exam.    Health Maintenance Summary Reviewed and updated, unless pt declines services.  Colonoscopy:   NA Tobacco History Reviewed:   never CT scan for screening lung CA:   NA Abdominal Ultrasound:  NA Alcohol:    No concerns, no excessive use Exercise Habits:   Yes, he runs and bikes daily and lifts weights 3-4 times a week. He states he is doing a spartan race in the near future.  STD concerns:   None, no new exposures, no new partners or concerns.  Denies  need for STD testing. Drug Use:   None Birth control method:   n/a Testicular/penile concerns:     NA  He denies all bowel problems, heart problems, or any new problems. He drinks 5-6 bottles of water/day, but increases this when he goes to the gym. He drinks 4-5 bottles of water when he is at the gym. He states he has recent R arm soreness but this is because he started doing rock wall climbing recently.   Eye exam: last  dilated eye exam was 3 years ago.  Dental exam: he goes every 6 months for cleanings.  Dermatology: he has been to a dermatologist for skin screenings. His last exam was about 2 years ago. He states he goes to a tanning salon once a week.   No FMHx of colon CA, early CAD, skin cancers,  He has a FMHx of CA in his Grandmother (pancreatic), grandfather (liver), but they died at age 47 or older.   Immunization History  Administered Date(s) Administered  . Influenza-Unspecified 03/06/2016  . Tdap 03/21/2016    Health Maintenance  Topic Date Due  . INFLUENZA VACCINE  01/26/2018 (Originally 01/08/2017)  . TETANUS/TDAP  03/21/2026  . HIV Screening  Completed    Wt Readings from Last 3 Encounters:  07/15/17 151 lb 11.2 oz (68.8 kg)  05/26/17 146 lb 4.8 oz (66.4 kg)  12/16/16 140 lb 14.4 oz (63.9 kg)   BP Readings from Last 3 Encounters:  07/15/17 132/87  05/26/17 128/81  12/16/16 127/83   Pulse Readings from Last 3 Encounters:  07/15/17 71  05/26/17 63  12/16/16 (!) 55    Patient Active Problem List   Diagnosis Date Noted  . Generalized OA- esp hands 05/26/2017  . Insomnia 12/16/2016  . Tenosynovitis of fingers- bilateral second and third digits 12/16/2016  . Acute reaction to stress 12/16/2016  . Total bilirubin, elevated 11/07/2016  . Stress due to marital conflict 02/02/2016  . Counseling on health promotion and disease prevention 02/02/2016  . Elevated blood pressure reading without diagnosis of hypertension 02/02/2016  . GERD (gastroesophageal reflux disease) 01/12/2016  . H/O motion sickness/ seasickness 01/12/2016  . Sciatic nerve pain- R 01/09/2016  . Chronic lower back pain 01/09/2016  . Right lumbar radiculopathy 01/09/2016  . h/o Left ACL tear- s/p repair. 12/08/2014    Past Medical History:  Diagnosis Date  . Chronic lower back pain 01/09/2016  . GERD (gastroesophageal reflux disease) 01/12/2016  . h/o Left ACL tear- s/p repair. 12/08/2014  . H/O motion  sickness/ seasickness 01/12/2016  . Right lumbar radiculopathy 01/09/2016  . Sciatic nerve pain- R 01/09/2016    Past Surgical History:  Procedure Laterality Date  . KNEE ARTHROSCOPY Left     Family History  Problem Relation Age of Onset  . Cancer Maternal Grandfather        bone    Social History   Substance and Sexual Activity  Drug Use No  ,  Social History   Substance and Sexual Activity  Alcohol Use No  ,  Social History   Tobacco Use  Smoking Status Never Smoker  Smokeless Tobacco Never Used  ,  Social History   Substance and Sexual Activity  Sexual Activity Yes    Patient's Medications  New Prescriptions   No medications on file  Previous Medications   AMITRIPTYLINE (ELAVIL) 25 MG TABLET    2 daily at bedtime when necessary insomnia or pain   GABAPENTIN (NEURONTIN) 100 MG CAPSULE    Take  1 capsule (100 mg total) by mouth 3 (three) times daily.   IBUPROFEN (ADVIL,MOTRIN) 200 MG TABLET    Take 200 mg by mouth every 6 (six) hours as needed. Takes 4 tablets as needed   MELOXICAM (MOBIC) 15 MG TABLET    Take 1 tablet (15 mg total) by mouth daily.   OMEPRAZOLE (PRILOSEC) 20 MG CAPSULE    Take 1 capsule (20 mg total) by mouth daily.   RANITIDINE (ZANTAC) 300 MG TABLET    Take 1 tablet (300 mg total) by mouth 2 (two) times daily.  Modified Medications   No medications on file  Discontinued Medications   No medications on file    Oxycodone  Review of Systems: General:   Denies fever, chills, unexplained weight loss.  Optho/Auditory:   Denies visual changes, blurred vision/LOV Respiratory:   Denies SOB, DOE more than baseline levels.  Cardiovascular:   Denies chest pain, palpitations, new onset peripheral edema  Gastrointestinal:   Denies nausea, vomiting, diarrhea.  Genitourinary: Denies dysuria, freq/ urgency, flank pain or discharge from genitals.  Endocrine:     Denies hot or cold intolerance, polyuria, polydipsia. Musculoskeletal:   Denies unexplained  myalgias, joint swelling, unexplained arthralgias, gait problems.  Skin:  Denies rash, suspicious lesions Neurological:     Denies dizziness, unexplained weakness, numbness  Psychiatric/Behavioral:   Denies mood changes, suicidal or homicidal ideations, hallucinations    Objective:     Blood pressure 132/87, pulse 71, height 5\' 6"  (1.676 m), weight 151 lb 11.2 oz (68.8 kg), SpO2 99 %. Body mass index is 24.49 kg/m. General Appearance:    Alert, cooperative, no distress, appears younger than stated age;  Muscular physique, in good physical shape  Head:    Normocephalic, without obvious abnormality, atraumatic  Eyes:    PERRL, conjunctiva/corneas clear, EOM's intact, fundi    benign, both eyes  Ears:    Normal TM's and external ear canals, both ears  Nose:   Nares normal, septum midline, mucosa normal, no drainage    or sinus tenderness  Throat:   Lips w/o lesion, mucosa moist, and tongue normal; teeth and   gums normal  Neck:   Supple, symmetrical, trachea midline, no adenopathy;    thyroid:  no enlargement/tenderness/nodules; no carotid   bruit or JVD  Back:     Symmetric, no curvature, ROM normal, no CVA tenderness  Lungs:     Clear to auscultation bilaterally, respirations unlabored, no       Wh/ R/ R  Chest Wall:    No tenderness or gross deformity; normal excursion   Heart:    Regular rate and rhythm, S1 and S2 normal, no murmur, rub   or gallop  Abdomen:     Soft, non-tender, bowel sounds active all four quadrants, NO   G/R/R, no masses, no organomegaly  Genitalia:    Ext genitalia: without lesion, no penile rash or discharge, no hernias appreciated    Rectal:    Normal tone, prostate WNL's and equal b/l, no tenderness; guaiac negative stool. One external hemorrhoid 2 mm diameter, non-thrombosed and nontender, looks normal   Extremities:   Extremities normal, atraumatic, no cyanosis or gross edema  Pulses:   2+ and symmetric all extremities  Skin:   Warm, dry, Skin color,  texture, turgor normal, no obvious rashes or lesions. Hypopigmented patches scattered upper back/shoulders area, consistent with tinea versicolor   M-Sk:   Ambulates * 4 w/o difficulty, no gross deformities, tone WNL  Neurologic:  CNII-XII intact, normal strength, sensation and reflexes    Throughout Psych:  No HI/SI, judgement and insight good, Euthymic mood. Full Affect.

## 2017-07-16 LAB — CBC WITH DIFFERENTIAL/PLATELET
BASOS: 1 %
Basophils Absolute: 0 10*3/uL (ref 0.0–0.2)
EOS (ABSOLUTE): 0.2 10*3/uL (ref 0.0–0.4)
Eos: 5 %
Hematocrit: 43.3 % (ref 37.5–51.0)
Hemoglobin: 14.7 g/dL (ref 13.0–17.7)
Immature Grans (Abs): 0 10*3/uL (ref 0.0–0.1)
Immature Granulocytes: 0 %
Lymphocytes Absolute: 2 10*3/uL (ref 0.7–3.1)
Lymphs: 40 %
MCH: 28.9 pg (ref 26.6–33.0)
MCHC: 33.9 g/dL (ref 31.5–35.7)
MCV: 85 fL (ref 79–97)
Monocytes Absolute: 0.3 10*3/uL (ref 0.1–0.9)
Monocytes: 7 %
NEUTROS ABS: 2.5 10*3/uL (ref 1.4–7.0)
NEUTROS PCT: 47 %
PLATELETS: 227 10*3/uL (ref 150–379)
RBC: 5.09 x10E6/uL (ref 4.14–5.80)
RDW: 14.6 % (ref 12.3–15.4)
WBC: 5.1 10*3/uL (ref 3.4–10.8)

## 2017-07-16 LAB — COMPREHENSIVE METABOLIC PANEL
A/G RATIO: 1.7 (ref 1.2–2.2)
ALT: 32 IU/L (ref 0–44)
AST: 30 IU/L (ref 0–40)
Albumin: 4.5 g/dL (ref 3.5–5.5)
Alkaline Phosphatase: 65 IU/L (ref 39–117)
BUN/Creatinine Ratio: 16 (ref 9–20)
BUN: 18 mg/dL (ref 6–24)
Bilirubin Total: 1.1 mg/dL (ref 0.0–1.2)
CALCIUM: 9.6 mg/dL (ref 8.7–10.2)
CO2: 24 mmol/L (ref 20–29)
Chloride: 105 mmol/L (ref 96–106)
Creatinine, Ser: 1.16 mg/dL (ref 0.76–1.27)
GFR, EST AFRICAN AMERICAN: 87 mL/min/{1.73_m2} (ref >59)
GFR, EST NON AFRICAN AMERICAN: 75 mL/min/{1.73_m2} (ref >59)
GLOBULIN, TOTAL: 2.6 g/dL (ref 1.5–4.5)
Glucose: 90 mg/dL (ref 65–99)
POTASSIUM: 4.5 mmol/L (ref 3.5–5.2)
SODIUM: 143 mmol/L (ref 134–144)
TOTAL PROTEIN: 7.1 g/dL (ref 6.0–8.5)

## 2017-07-16 LAB — LIPID PANEL
Chol/HDL Ratio: 4.2 ratio (ref 0.0–5.0)
Cholesterol, Total: 194 mg/dL (ref 100–199)
HDL: 46 mg/dL (ref >39)
LDL CALC: 134 mg/dL — AB (ref 0–99)
Triglycerides: 72 mg/dL (ref 0–149)
VLDL CHOLESTEROL CAL: 14 mg/dL (ref 5–40)

## 2017-07-16 LAB — HEMOGLOBIN A1C
Est. average glucose Bld gHb Est-mCnc: 103 mg/dL
Hgb A1c MFr Bld: 5.2 % (ref 4.8–5.6)

## 2017-07-16 LAB — VITAMIN D 25 HYDROXY (VIT D DEFICIENCY, FRACTURES): VIT D 25 HYDROXY: 42.2 ng/mL (ref 30.0–100.0)

## 2017-07-16 LAB — TSH: TSH: 2.39 u[IU]/mL (ref 0.450–4.500)

## 2017-07-29 ENCOUNTER — Telehealth: Payer: Self-pay | Admitting: Family Medicine

## 2017-07-29 NOTE — Telephone Encounter (Signed)
Patient called wanting some clinical information. He says he is dealing with a recurring "pinched nerve" in his back that is causing right hand numbness. He thinks in the past we was taking a med (prescribed by Dr. Val Eagle) for this and wants to know if he is still on that med or needs to be back on that med. He would like to speak to someone about this issue.

## 2017-08-04 NOTE — Telephone Encounter (Signed)
Patient notified. MPulliam, CMA/RT(R)  

## 2017-08-04 NOTE — Telephone Encounter (Signed)
I seen him in the past for lower back pain that radiated down his leg.  I never saw him for neck pain that radiated down his arm.  This is new and something that should be evaluated.   I recommend he make a follow-up with the orthopedist- Arlys John( Brian or [facility] Thomasena [facility]ollins [facility] )  that we sent him to in the past for his radiculopathy to get that checked out because I do not like the fact this is new symptoms, now going into the arm.  They have an MRi machine right there in their office if they feel it might be necessary to obtain further imaging to see the nerve roots etc.   Until he is seen and properly evaluated, it is fine for him to take the gabapentin.

## 2017-08-04 NOTE — Telephone Encounter (Signed)
Spoke to the patient.  He states that he had the flu about 2 weeks ago and while coughing felt that he pulled his back - 11 days ago.  Patient states that he has chronic issues with his back and has been seen in the office for the issue in the past.  Patient states that he has had increased pain in the back that radiates into th right arm with numbness and tingling down into the had affecting the 1st, 2nd, and 3rd digits of the hand x 9 days. Patient states this has been worse for the last 3 days.  Patient has not been taking the Gabapentin.  Patient wants to know if there is any exercises that he can be doing to loosen up the muscles and if he should take the Gabapentin. Patient was last seen on 07/15/2017.  Please advise.  MPulliam, CMA/RT(R)

## 2017-08-06 ENCOUNTER — Other Ambulatory Visit: Payer: Self-pay

## 2017-08-06 ENCOUNTER — Telehealth: Payer: Self-pay | Admitting: Family Medicine

## 2017-08-06 MED ORDER — PREDNISONE 20 MG PO TABS: ORAL_TABLET | ORAL | 0 refills | Status: DC

## 2017-08-06 NOTE — Telephone Encounter (Signed)
Patient called request medical assistant contact him @ [phone]----Pt did not state reason.  --glh

## 2017-08-06 NOTE — Telephone Encounter (Signed)
Spoke to patient please see not in chart. MPulliam, CMA/RT(R)

## 2017-08-06 NOTE — Progress Notes (Signed)
Patient is still having pain with his neck/back.  Patient advised to make appointment for ortho and prednisone tamper was sent in per Dr. Sharee Holsterpalski instructions. MPulliam, CMA/RT(R)

## 2017-11-27 ENCOUNTER — Other Ambulatory Visit: Payer: Self-pay | Admitting: Family Medicine

## 2017-11-27 DIAGNOSIS — K219 Gastro-esophageal reflux disease without esophagitis: Secondary | ICD-10-CM

## 2017-11-27 DIAGNOSIS — M5431 Sciatica, right side: Secondary | ICD-10-CM

## 2017-11-27 DIAGNOSIS — G4700 Insomnia, unspecified: Secondary | ICD-10-CM

## 2018-01-06 ENCOUNTER — Ambulatory Visit (INDEPENDENT_AMBULATORY_CARE_PROVIDER_SITE_OTHER): Payer: BLUE CROSS/BLUE SHIELD | Admitting: Family Medicine

## 2018-01-06 ENCOUNTER — Encounter: Payer: Self-pay | Admitting: Family Medicine

## 2018-01-06 VITALS — BP 126/78 | HR 72 | Ht 66.0 in | Wt 151.0 lb

## 2018-01-06 DIAGNOSIS — M25561 Pain in right knee: Secondary | ICD-10-CM

## 2018-01-06 DIAGNOSIS — Z9889 Other specified postprocedural states: Secondary | ICD-10-CM | POA: Diagnosis not present

## 2018-01-06 DIAGNOSIS — S8991XA Unspecified injury of right lower leg, initial encounter: Secondary | ICD-10-CM

## 2018-01-06 NOTE — Progress Notes (Signed)
Impression and Recommendations:    1. Injury, knee, right, initial encounter   2. Acute pain of right knee   3. H/O left knee surgery- acl repair, meniscal etc    1. Right Knee Pain - Referral placed to Orthopedics today.  [facility]  - Further care and treatment plan will be recommended through Ortho.  - NSAIDS when necessary, PRN for pain; risk benefits discussed with patient. - Prop affected knee on a pillow at night for stability and pain relief.  - ICE for 15-20 minutes, 3-4 times per day. - Avoid aggravating activities such as going up and down hills or stairs.  2. Follow-Up - Pt will make follow-up appointment for complete physical exam with fasting blood work in the near future at [employer]'s convenience.   Education and routine counseling performed. Handouts provided.  Orders Placed This Encounter  Procedures  . AMB referral to orthopedics    The patient was counselled, risk factors were discussed, anticipatory guidance given.   Return for to ortho for eval of ligamentous or meniscal tear.  Please see AVS handed out to patient at the end of our visit for further patient instructions/ counseling done pertaining to today's office visit.  Note: This document was prepared using Dragon voice recognition software and may include unintentional dictation errors.  This document serves as a record of services personally performed by Thomasene Loteborah Christabel Camire, DO. It was created on her behalf by Peggye FothergillKatherine Galloway, a trained medical scribe. The creation of this record is based on the scribe's personal observations and the provider's statements to them.   I have reviewed the above medical documentation for accuracy and completeness and I concur.  Thomasene LotDeborah Byan Poplaski 01/19/18 10:51 AM     --------------------------------------------------------------------------------------------------------------------------------------------------------------------------------------------------------------------------   Subjective: Steve [name] is a 47 y.o. male who sustained a right knee injury 3-4 days ago.   Notes "normally I wouldn't bother coming."   Mechanism of injury:  Running a marathon; planted his foot and got stuck in place while the rest of his body "went over."   Immediate symptoms:  Pain, feeling like his knee "gave out."  Symptoms have been worsening since that time.  Pain is on the lateral aspect of right knee.  Prior history of related problems: Patient is very active; describes general "knee problems" in both knees.  Has a history of left knee surgery.  History of Onset: [place] 8 [place], 30 obstacles.  [place]  Notes he had pain in right knee during training; eased off and took it easy, did bike more than running.  During the actual marathon, he hurt his right knee.  In the first half mile, they did a river run walking downriver waist deep; he got stuck between two rocks.  States "I went over, and my leg stayed straight up."  Confirms that his leg stayed in one place while his body moved.  States it started hurting really badly while running downhill.  Went to an elevation of 3200 feet from 1000 feet.  Quality: He feels "creaking."  Has intense pain going down steps.  "It's always going down."  Rates his pain at  "3 going up, but a 9 going down."   Patient Care Team    Relationship Specialty Notifications Start End  Thomasene Lotpalski, Tyree Fluharty, DO PCP - General Family Medicine  01/09/16      Past Medical  History:  Diagnosis Date  . Chronic lower back pain 01/09/2016  . GERD (gastroesophageal reflux disease) 01/12/2016  . h/o Left ACL tear- s/p repair. 12/08/2014  . H/O motion  sickness/ seasickness 01/12/2016  . Right lumbar radiculopathy 01/09/2016  . Sciatic nerve pain- R 01/09/2016    Past Surgical History:  Procedure Laterality Date  . KNEE ARTHROSCOPY Left     @[facility] @  Social History   Substance and Sexual Activity  Drug Use No  ,  Social History   Substance and Sexual Activity  Alcohol Use No  ,  Social History   Tobacco Use  Smoking Status Never Smoker  Smokeless Tobacco Never Used  ,  Social History   Substance and Sexual Activity  Sexual Activity Yes    Patient's Medications  New Prescriptions   No medications on file  Previous Medications   AMITRIPTYLINE (ELAVIL) 25 MG TABLET    2 DAILY AT BEDTIME WHEN NECESSARY INSOMNIA OR PAIN   GABAPENTIN (NEURONTIN) 100 MG CAPSULE    Take 1 capsule (100 mg total) by mouth 3 (three) times daily.   IBUPROFEN (ADVIL,MOTRIN) 200 MG TABLET    Take 200 mg by mouth every 6 (six) hours as needed. Takes 4 tablets as needed   MELOXICAM (MOBIC) 15 MG TABLET    Take 1 tablet (15 mg total) by mouth daily.   OMEPRAZOLE (PRILOSEC) 20 MG CAPSULE    TAKE 1 CAPSULE BY MOUTH EVERY DAY   PREDNISONE (DELTASONE) 20 MG TABLET    60 mg for 2 days, then 40 mg for 2 days, then 20 mg for 2 days, then 10 mg for 2 days   RANITIDINE (ZANTAC) 300 MG TABLET    TAKE 1 TABLET (300 MG TOTAL) BY MOUTH 2 (TWO) TIMES DAILY.  Modified Medications   No medications on file  Discontinued Medications   No medications on file    Oxycodone  Scheduled Meds: Continuous Infusions: PRN Meds:.  Review of Systems: General:   Denies fever, chills, unexplained weight loss.  Optho/Auditory:   Denies visual changes, blurred vision/LOV Respiratory:   Denies SOB, DOE more than baseline levels.  Cardiovascular:   Denies chest pain, palpitations, new onset peripheral edema  Gastrointestinal:   Denies nausea, vomiting, diarrhea.  Genitourinary: Denies dysuria, freq/ urgency, flank pain or discharge from genitals.  Endocrine:     Denies hot or  cold intolerance, polyuria, polydipsia. Musculoskeletal:   See above in HPI  Skin:  Denies rash, suspicious lesions Neurological:     Denies dizziness, unexplained weakness, numbness  Psychiatric/Behavioral:   Denies mood changes, suicidal or homicidal ideations, hallucinations   Objective:   Blood pressure 126/78, pulse 72, height 5\' 6"  (1.676 m), weight 151 lb (68.5 kg), SpO2 98 %. Body mass index is 24.37 kg/m. General: Well Developed, well nourished, and in no acute distress.  Neuro: Alert and oriented x3, extra-ocular muscles intact, sensation grossly intact.  HEENT: Normocephalic, atraumatic, pupils equal round reactive to light, neck supple Skin: no gross suspicious lesions or rashes  Cardiac: Regular rate and rhythm, no murmurs rubs or gallops.  Respiratory: Essentially clear to auscultation bilaterally. Not using accessory muscles, speaking in full sentences.  Abdominal: Soft, not grossly distended Musculoskeletal: Ambulates w/o diff, FROM * 4 ext.  Right Knee: Increased warmth in Right Knee vs Left.  Posterior joint space slightly more edematous, right vs left.  Lateral joint space of knee is TTP with crepitus on flexion/extension. No peripatellar swelling; postiive anterior drawer test with more laxity  vs left (left was surgically repaired). Negative varus stress test; negative valgus stress test. Mild soft tissue swelling posterior knee, no ecchymosis, normal contralateral knee exam. Neurovascularly intact distally. Positive McMurray's and Apley's on the right. Vasc: less 2 sec cap RF, warm and pink  Psych: No HI/SI, judgement and insight good, Euthymic mood. Full Affect.

## 2018-01-06 NOTE — Patient Instructions (Signed)
Due to mechanism of injury will get him to ortho for eval of ligamentous or meniscal tear  Ice for 15 to 20 minutes every hour front and back.  Please avoid all aggravating activities especially going up and down hills as well as stairs if possible.  Please take NSAIDs such as Aleve or Advil over-the-counter as needed for pain and you can prop it up on a pillow at night for stability and pain relief

## 2018-01-12 ENCOUNTER — Ambulatory Visit: Payer: BLUE CROSS/BLUE SHIELD | Admitting: Family Medicine

## 2018-01-15 DIAGNOSIS — M25561 Pain in right knee: Secondary | ICD-10-CM | POA: Diagnosis not present

## 2018-02-27 DIAGNOSIS — W270XXA Contact with workbench tool, initial encounter: Secondary | ICD-10-CM | POA: Diagnosis not present

## 2018-02-27 DIAGNOSIS — G8911 Acute pain due to trauma: Secondary | ICD-10-CM | POA: Diagnosis not present

## 2018-02-27 DIAGNOSIS — S61012A Laceration without foreign body of left thumb without damage to nail, initial encounter: Secondary | ICD-10-CM | POA: Diagnosis not present

## 2018-02-27 DIAGNOSIS — Z791 Long term (current) use of non-steroidal anti-inflammatories (NSAID): Secondary | ICD-10-CM | POA: Diagnosis not present

## 2018-06-19 ENCOUNTER — Other Ambulatory Visit: Payer: Self-pay | Admitting: Family Medicine

## 2018-06-19 DIAGNOSIS — G4700 Insomnia, unspecified: Secondary | ICD-10-CM

## 2018-06-19 DIAGNOSIS — M5431 Sciatica, right side: Secondary | ICD-10-CM

## 2018-06-24 ENCOUNTER — Other Ambulatory Visit: Payer: Self-pay | Admitting: Family Medicine

## 2018-06-24 DIAGNOSIS — M659 Synovitis and tenosynovitis, unspecified: Secondary | ICD-10-CM

## 2018-07-23 ENCOUNTER — Encounter: Payer: Self-pay | Admitting: Family Medicine

## 2018-07-23 ENCOUNTER — Ambulatory Visit: Payer: BLUE CROSS/BLUE SHIELD | Admitting: Family Medicine

## 2018-07-23 VITALS — BP 122/84 | HR 77 | Temp 98.3°F | Ht 66.0 in | Wt 158.6 lb

## 2018-07-23 DIAGNOSIS — M545 Low back pain: Secondary | ICD-10-CM | POA: Diagnosis not present

## 2018-07-23 DIAGNOSIS — G8929 Other chronic pain: Secondary | ICD-10-CM

## 2018-07-23 DIAGNOSIS — M159 Polyosteoarthritis, unspecified: Secondary | ICD-10-CM

## 2018-07-23 DIAGNOSIS — M659 Synovitis and tenosynovitis, unspecified: Secondary | ICD-10-CM

## 2018-07-23 DIAGNOSIS — G43009 Migraine without aura, not intractable, without status migrainosus: Secondary | ICD-10-CM

## 2018-07-23 DIAGNOSIS — M5431 Sciatica, right side: Secondary | ICD-10-CM | POA: Diagnosis not present

## 2018-07-23 DIAGNOSIS — K219 Gastro-esophageal reflux disease without esophagitis: Secondary | ICD-10-CM | POA: Diagnosis not present

## 2018-07-23 MED ORDER — OMEPRAZOLE 20 MG PO CPDR: DELAYED_RELEASE_CAPSULE | ORAL | 3 refills | Status: DC

## 2018-07-23 MED ORDER — MELOXICAM 15 MG PO TABS
15.0000 mg | ORAL_TABLET | Freq: Every day | ORAL | 1 refills | Status: DC
Start: 2018-07-23 — End: 2019-03-29

## 2018-07-23 MED ORDER — SUMATRIPTAN 20 MG/ACT NA SOLN: NASAL | 2 refills | Status: AC

## 2018-07-23 NOTE — Patient Instructions (Addendum)
Please make sure you are checking your blood pressure a couple times weekly to see what it runs at home on average.     Migraine Headache A migraine headache is an intense, throbbing pain on one side or both sides of the head. Migraines may also cause other symptoms, such as nausea, vomiting, and sensitivity to light and noise. What are the causes? Doing or taking certain things may also trigger migraines, such as:  Alcohol.  Smoking.  Medicines, such as: ? Medicine used to treat chest pain (nitroglycerine). ? Birth control pills. ? Estrogen pills. ? Certain blood pressure medicines.  Aged cheeses, chocolate, or caffeine.  Foods or drinks that contain nitrates, glutamate, aspartame, or tyramine.  Physical activity. Other things that may trigger a migraine include:  Menstruation.  Pregnancy.  Hunger.  Stress, lack of sleep, too much sleep, or fatigue.  Weather changes. What increases the risk? The following factors may make you more likely to experience migraine headaches:  Age. Risk increases with age.  Family history of migraine headaches.  Being Caucasian.  Depression and anxiety.  Obesity.  Being a woman.  Having a hole in the heart (patent foramen ovale) or other heart problems. What are the signs or symptoms? The main symptom of this condition is pulsating or throbbing pain. Pain may:  Happen in any area of the head, such as on one side or both sides.  Interfere with daily activities.  Get worse with physical activity.  Get worse with exposure to bright lights or loud noises. Other symptoms may include:  Nausea.  Vomiting.  Dizziness.  General sensitivity to bright lights, loud noises, or smells. Before you get a migraine, you may get warning signs that a migraine is developing (aura). An aura may include:  Seeing flashing lights or having blind spots.  Seeing bright spots, halos, or zigzag lines.  Having tunnel vision or blurred  vision.  Having numbness or a tingling feeling.  Having trouble talking.  Having muscle weakness. How is this diagnosed? A migraine headache can be diagnosed based on:  Your symptoms.  A physical exam.  Tests, such as CT scan or MRI of the head. These imaging tests can help rule out other causes of headaches.  Taking fluid from the spine (lumbar puncture) and analyzing it (cerebrospinal fluid analysis, or CSF analysis). How is this treated? A migraine headache is usually treated with medicines that:  Relieve pain.  Relieve nausea.  Prevent migraines from coming back. Treatment may also include:  Acupuncture.  Lifestyle changes like avoiding foods that trigger migraines. Follow these instructions at home: Medicines  Take over-the-counter and prescription medicines only as told by your health care provider.  Do not drive or use heavy machinery while taking prescription pain medicine.  To prevent or treat constipation while you are taking prescription pain medicine, your health care provider may recommend that you: ? Drink enough fluid to keep your urine clear or pale yellow. ? Take over-the-counter or prescription medicines. ? Eat foods that are high in fiber, such as fresh fruits and vegetables, whole grains, and beans. ? Limit foods that are high in fat and processed sugars, such as fried and sweet foods. Lifestyle  Avoid alcohol use.  Do not use any products that contain nicotine or tobacco, such as cigarettes and e-cigarettes. If you need help quitting, ask your health care provider.  Get at least 8 hours of sleep every night.  Limit your stress. General instructions      Keep  a journal to find out what may trigger your migraine headaches. For example, write down: ? What you eat and drink. ? How much sleep you get. ? Any change to your diet or medicines.  If you have a migraine: ? Avoid things that make your symptoms worse, such as bright lights. ? It  may help to lie down in a dark, quiet room. ? Do not drive or use heavy machinery. ? Ask your health care provider what activities are safe for you while you are experiencing symptoms.  Keep all follow-up visits as told by your health care provider. This is important. Contact a health care provider if:  You develop symptoms that are different or more severe than your usual migraine symptoms. Get help right away if:  Your migraine becomes severe.  You have a fever.  You have a stiff neck.  You have vision loss.  Your muscles feel weak or like you cannot control them.  You start to lose your balance often.  You develop trouble walking.  You faint. This information is not intended to replace advice given to you by your health care provider. Make sure you discuss any questions you have with your health care provider. Document Released: 05/27/2005 Document Revised: 12/15/2015 Document Reviewed: 11/13/2015 Elsevier Interactive Patient Education  2019 [employer].

## 2018-07-23 NOTE — Progress Notes (Signed)
Impression and Recommendations:    1. Gastroesophageal reflux disease, esophagitis presence not specified   2. Generalized OA- esp hands   3. Chronic low back pain, unspecified back pain laterality, unspecified whether sciatica present   4. Right sciatic nerve pain   5. Migraine without aura and without status migrainosus, not intractable   6. Tenosynovitis of fingers- bilateral second and third digits     1. Right Meniscal Injury - Assessed by Specialist - Advised patient to slowly resume his exercise routine, engaging in low-impact activities.  - Discussed that patient may resume his activities slowly per prudent recommendations.  - Patient knows that if his symptoms exacerbate in the future, further care and treatment plan will be coordinated through Ortho.  - Will continue to monitor.  2. Migraine Without Aura - Onset Past Two Years - Reviewed migraine triggers at length with patient today. - Discussed that stress is likely the patient's trigger.  - Imitrex discussed and provided today, for use PRN. - Add 600 mg of Advil OTC to oral Imitrex. - Advised patient to take his medication as soon as he begins to feel symptoms.  - Advised patient to check with his insurance about what form of Imitrex is best.  - Educated patient extensively about causes and treatments of migraine headache. - Advised patient to try to identify migraine triggers, such a stressful events.  - If headaches become more common, such as 3-4 times per month, patient knows to let us know.  - If migraines do not resolve, discussed referring the patient for further assessment in future.  - Will continue to monitor.  3. Stress Management - Ongoing healthy habits encouraged today. - Discussed stress management at length with patient, which may also help his migraines to improve.  - Reviewed the "spokes of the wheel" of mood and health management.  Stressed the importance of ongoing prudent habits,  including regular exercise, appropriate sleep hygiene, healthful dietary habits, and prayer/meditation to relax.  - Continue with adequate hydration.  4. Sleep Habits - Continue on elavil as prescribed.  - Prudent sleep hygiene habits discussed and recommended today.  - Recommended nightly sleep meditation to patient today.  - Advised patient to begin using a suitable sound machine at night.  5. Chronic Low Back Pain - Stable at this time. - Continue management as prescribed. - Will continue to monitor.  6. GERD - Stable at this time. - Refill provided today.  7. Blood Pressure Elevated on Intake - Ambulatory bblod pressure monitoring encouraged. - Will continue to monitor.   Education and routine counseling performed. Handouts provided.   Meds ordered this encounter  Medications  . SUMAtriptan (IMITREX) 20 MG/ACT nasal spray    Sig: 1 spray into nostril at onset of migraine. May repeat in 2 hours if headache persists or recurs. ( max 40mg Derl Barrow)    Dispense:  1 Inhaler    Refill:  2  . meloxicam (MOBIC) 15 MG tablet    Sig: Take 1 tablet (15 mg total) by mouth daily.    Dispense:  90 tablet    Refill:  1  . omeprazole (PRILOSEC) 20 MG capsule    Sig: TAKE 1 CAPSULE BY MOUTH EVERY DAY    Dispense:  90 capsule    Refill:  3    Medications Discontinued During This Encounter  Medication Reason  . gabapentin (NEURONTIN) 100 MG capsule No longer needed (for PRN medications)  . ranitidine (ZANTAC) 300 MG tablet Change  in therapy  . meloxicam (MOBIC) 15 MG tablet Reorder  . omeprazole (PRILOSEC) 20 MG capsule Reorder     Gross side effects, risk and benefits, and alternatives of medications and treatment plan in general discussed with patient.  Patient is aware that all medications have potential side effects and we are unable to predict every side effect or drug-drug interaction that may occur.   Patient will call with any questions prior to using medication if they  have concerns.    Expresses verbal understanding and consents to current therapy and treatment regimen.  No barriers to understanding were identified.  Red flag symptoms and signs discussed in detail.  Patient expressed understanding regarding what to do in case of emergency\urgent symptoms  Please see AVS handed out to patient at the end of our visit for further patient instructions/ counseling done pertaining to today's office visit.   Return for f/up in near future if ha not improved otherwise as discussed previously for chronic care.     Note:  This note was prepared with assistance of Dragon voice recognition software. Occasional wrong-word or sound-a-like substitutions may have occurred due to the inherent limitations of voice recognition software.   This document serves as a record of services personally performed by Thomasene Lot, DO. It was created on her behalf by [name], a trained medical scribe. The creation of this record is based on the scribe's personal observations and the provider's statements to them.   I have reviewed the above medical documentation for accuracy and completeness and I concur.  Thomasene Lot, DO 07/23/2018 5:34 PM       ------------------------------------------------------------------------------------------------------------------------    Subjective:     HPI: Jaydyn Bozzo is a 48 y.o. male who presents to [facility] today for issues as discussed below.  Doing good, notes nothing really new.    Notes that he and his wife have been divorced since July of 2019 and are dealing with "house stuff now."  Everything is supposed to be finalized on Tuesday.  Feels that custody is going well, everything is going well, he and his ex are very amicable.  Patient has been seeing the same girl since June of last year, and he feels happy with this relationship.  He has been trying to drink a gallon of water per  day.  Weight Gain Patient is up 7 lbs since July of 2019.  He is feeling frustrated with his weight gain, but has not been as active as he would like to be since his knee injury this summer.  Right Knee Injury in July of 2019 Was told by Orthopedic specialist he may have a stress fracture in his meniscus.  Did physical therapy and rehab to treat this.  Patient was told to lay off of everything for a time, but does have a history of exercising against restrictions.   Just recently bought two new mountain bikes, and wants to continue low impact exercise routines.  Sleep  Patient takes two elavil per night, which helps him sleep.  Notes "sometimes I mess up and take it a little later than I should, and it drags on to the next morning, but most of the time I'm good."  Patient states that he can get to sleep fine, but his main issue is staying asleep all night.  Back Pain Patient's back pain.  Denies nerve problems or muscle problems; states "that's staying pretty good."  Migraines - Onset the Past Two Years  For some reason over the past year, in the past couple of months, patient has had several migraines, 't the point where I can't function."  He gets up and vomits.  Notes "I've never had a headache for the most part in the first 45 years."  But in the past couple of years this has begun happening.  Typically wakes up with the migraine, early in the morning.  When he's up, he feels sick, but when he's down, the nausea goes away but the intensity of the headache comes back.  Notes last migraine, he threw up all day, and had intense pain for 12 hours.  Feels he has one migraine every other month.  Feels that the earlier his migraine hits, the more he vomits that day.  When the migraines hit him mid-morning or mid-noon, he feels he vomits less.    Wt Readings from Last 3 Encounters:  07/23/18 158 lb 9.6 oz (71.9 kg)  01/06/18 151 lb (68.5 kg)  07/15/17 151 lb 11.2 oz (68.8 kg)   BP  Readings from Last 3 Encounters:  07/23/18 122/84  01/06/18 126/78  07/15/17 132/87   Pulse Readings from Last 3 Encounters:  07/23/18 77  01/06/18 72  07/15/17 71   BMI Readings from Last 3 Encounters:  07/23/18 25.60 kg/m  01/06/18 24.37 kg/m  07/15/17 24.49 kg/m     Patient Care Team    Relationship Specialty Notifications Start End  Thomasene Lotpalski, Virdell Hoiland, DO PCP - General Family Medicine  01/09/16      Patient Active Problem List   Diagnosis Date Noted  . Generalized OA- esp hands 05/26/2017  . Insomnia 12/16/2016  . Tenosynovitis of fingers- bilateral second and third digits 12/16/2016  . Acute reaction to stress 12/16/2016  . Total bilirubin, elevated 11/07/2016  . Stress due to marital conflict 02/02/2016  . Counseling on health promotion and disease prevention 02/02/2016  . Elevated blood pressure reading without diagnosis of hypertension 02/02/2016  . GERD (gastroesophageal reflux disease) 01/12/2016  . H/O motion sickness/ seasickness 01/12/2016  . Sciatic nerve pain- R 01/09/2016  . Chronic lower back pain 01/09/2016  . Right lumbar radiculopathy 01/09/2016  . h/o Left ACL tear- s/p repair. 12/08/2014    Past Medical history, Surgical history, Family history, Social history, Allergies and Medications have been entered into the medical record, reviewed and changed as needed.    Current Meds  Medication Sig  . amitriptyline (ELAVIL) 25 MG tablet 2 daily at bedtime when necessary insomnia or pain.  Patient needs office visit for refills.  Marland Kitchen. ibuprofen (ADVIL,MOTRIN) 200 MG tablet Take 200 mg by mouth every 6 (six) hours as needed. Takes 4 tablets as needed  . meloxicam (MOBIC) 15 MG tablet Take 1 tablet (15 mg total) by mouth daily.  Marland Kitchen. omeprazole (PRILOSEC) 20 MG capsule TAKE 1 CAPSULE BY MOUTH EVERY DAY  . predniSONE (DELTASONE) 20 MG tablet 60 mg for 2 days, then 40 mg for 2 days, then 20 mg for 2 days, then 10 mg for 2 days  . [DISCONTINUED] meloxicam (MOBIC)  15 MG tablet TAKE 1 TABLET BY MOUTH EVERY DAY  . [DISCONTINUED] omeprazole (PRILOSEC) 20 MG capsule TAKE 1 CAPSULE BY MOUTH EVERY DAY    Allergies:  Allergies  Allergen Reactions  . Oxycodone Nausea And Vomiting     Review of Systems:  A fourteen system review of systems was performed and found to be positive as per HPI.   Objective:   Blood pressure 122/84, pulse 77,  temperature 98.3 F (36.8 C), height 5\' 6"  (1.676 m), weight 158 lb 9.6 oz (71.9 kg), SpO2 99 %. Body mass index is 25.6 kg/m. General:  Well Developed, well nourished, appropriate for stated age.  Neuro:  Alert and oriented,  extra-ocular muscles intact  HEENT:  Normocephalic, atraumatic, neck supple, no carotid bruits appreciated  Skin:  no gross rash, warm, pink. Cardiac:  RRR, S1 S2 Respiratory:  ECTA B/L and A/P, Not using accessory muscles, speaking in full sentences- unlabored. Vascular:  Ext warm, no cyanosis apprec.; cap RF less 2 sec. Psych:  No HI/SI, judgement and insight good, Euthymic mood. Full Affect.

## 2018-09-02 ENCOUNTER — Encounter: Payer: BLUE CROSS/BLUE SHIELD | Admitting: Family Medicine

## 2018-10-07 ENCOUNTER — Other Ambulatory Visit: Payer: Self-pay | Admitting: Family Medicine

## 2018-10-07 DIAGNOSIS — G4700 Insomnia, unspecified: Secondary | ICD-10-CM

## 2018-10-07 DIAGNOSIS — M5431 Sciatica, right side: Secondary | ICD-10-CM

## 2018-10-14 ENCOUNTER — Telehealth: Payer: Self-pay | Admitting: Family Medicine

## 2018-10-14 NOTE — Telephone Encounter (Signed)
-----   Message from [place] Crane, CMA sent at 10/07/2018  2:43 PM EDT ----- Patient is due for follow up, please call the patient to make an appointment.  Thanks. MPulliam, CMA/RT(R)

## 2018-10-22 ENCOUNTER — Other Ambulatory Visit: Payer: Self-pay | Admitting: Family Medicine

## 2018-10-22 DIAGNOSIS — G4700 Insomnia, unspecified: Secondary | ICD-10-CM

## 2018-10-22 DIAGNOSIS — M5431 Sciatica, right side: Secondary | ICD-10-CM

## 2018-10-31 ENCOUNTER — Other Ambulatory Visit: Payer: Self-pay | Admitting: Family Medicine

## 2018-10-31 DIAGNOSIS — G4700 Insomnia, unspecified: Secondary | ICD-10-CM

## 2018-10-31 DIAGNOSIS — M5431 Sciatica, right side: Secondary | ICD-10-CM

## 2018-11-03 ENCOUNTER — Encounter: Payer: BLUE CROSS/BLUE SHIELD | Admitting: Family Medicine

## 2018-11-18 ENCOUNTER — Other Ambulatory Visit: Payer: Self-pay | Admitting: Family Medicine

## 2018-11-18 DIAGNOSIS — G4700 Insomnia, unspecified: Secondary | ICD-10-CM

## 2018-11-18 DIAGNOSIS — M5431 Sciatica, right side: Secondary | ICD-10-CM

## 2018-12-27 ENCOUNTER — Other Ambulatory Visit: Payer: Self-pay | Admitting: Family Medicine

## 2018-12-27 DIAGNOSIS — M5431 Sciatica, right side: Secondary | ICD-10-CM

## 2018-12-27 DIAGNOSIS — G4700 Insomnia, unspecified: Secondary | ICD-10-CM

## 2019-01-06 ENCOUNTER — Other Ambulatory Visit: Payer: Self-pay

## 2019-01-06 DIAGNOSIS — M5431 Sciatica, right side: Secondary | ICD-10-CM

## 2019-01-06 DIAGNOSIS — G4700 Insomnia, unspecified: Secondary | ICD-10-CM

## 2019-01-06 NOTE — Telephone Encounter (Signed)
Pharmacy sent request for refill on Amitriptyline.  Reviewed patient needs office visit for refills per office policy - patient has been given a 30 and 15 day supply.  Refill denied. MPulliam, CMA/RT(R)

## 2019-01-29 DIAGNOSIS — L821 Other seborrheic keratosis: Secondary | ICD-10-CM | POA: Diagnosis not present

## 2019-01-29 DIAGNOSIS — L820 Inflamed seborrheic keratosis: Secondary | ICD-10-CM | POA: Diagnosis not present

## 2019-03-12 ENCOUNTER — Telehealth: Payer: Self-pay | Admitting: Family Medicine

## 2019-03-12 NOTE — Telephone Encounter (Signed)
--  Pt left message to call office for provider requested appt- glh  Patient is due for follow up, please call the patient to make an appointment.  Thanks. MPulliam, CMA/RT  --note to medical assistant  --glh

## 2019-03-12 NOTE — Telephone Encounter (Signed)
-----   Message from Jerilee Field, [place] sent at 01/06/2019  9:58 AM EDT ----- Patient is due for follow up, please call the patient to make an appointment.  Thanks. MPulliam, CMA/RT(R)

## 2019-03-22 ENCOUNTER — Other Ambulatory Visit: Payer: Self-pay | Admitting: Family Medicine

## 2019-03-22 DIAGNOSIS — M659 Synovitis and tenosynovitis, unspecified: Secondary | ICD-10-CM

## 2019-03-29 ENCOUNTER — Ambulatory Visit (INDEPENDENT_AMBULATORY_CARE_PROVIDER_SITE_OTHER): Payer: BC Managed Care – PPO | Admitting: Family Medicine

## 2019-03-29 ENCOUNTER — Encounter: Payer: Self-pay | Admitting: Family Medicine

## 2019-03-29 ENCOUNTER — Other Ambulatory Visit: Payer: Self-pay

## 2019-03-29 VITALS — BP 120/82 | Wt 150.0 lb

## 2019-03-29 DIAGNOSIS — S83512S Sprain of anterior cruciate ligament of left knee, sequela: Secondary | ICD-10-CM

## 2019-03-29 DIAGNOSIS — G43809 Other migraine, not intractable, without status migrainosus: Secondary | ICD-10-CM

## 2019-03-29 DIAGNOSIS — G4700 Insomnia, unspecified: Secondary | ICD-10-CM | POA: Diagnosis not present

## 2019-03-29 DIAGNOSIS — M659 Synovitis and tenosynovitis, unspecified: Secondary | ICD-10-CM | POA: Diagnosis not present

## 2019-03-29 DIAGNOSIS — K219 Gastro-esophageal reflux disease without esophagitis: Secondary | ICD-10-CM

## 2019-03-29 DIAGNOSIS — G43909 Migraine, unspecified, not intractable, without status migrainosus: Secondary | ICD-10-CM | POA: Insufficient documentation

## 2019-03-29 DIAGNOSIS — M159 Polyosteoarthritis, unspecified: Secondary | ICD-10-CM

## 2019-03-29 DIAGNOSIS — F430 Acute stress reaction: Secondary | ICD-10-CM | POA: Diagnosis not present

## 2019-03-29 DIAGNOSIS — M65949 Unspecified synovitis and tenosynovitis, unspecified hand: Secondary | ICD-10-CM

## 2019-03-29 DIAGNOSIS — M5431 Sciatica, right side: Secondary | ICD-10-CM

## 2019-03-29 MED ORDER — OMEPRAZOLE 20 MG PO CPDR: 20.0000 mg | DELAYED_RELEASE_CAPSULE | Freq: Two times a day (BID) | ORAL | 1 refills | Status: DC

## 2019-03-29 MED ORDER — TRAZODONE HCL 50 MG PO TABS: 50.0000 mg | ORAL_TABLET | Freq: Every evening | ORAL | 0 refills | Status: DC | PRN

## 2019-03-29 MED ORDER — MELOXICAM 15 MG PO TABS: 15.0000 mg | ORAL_TABLET | Freq: Every day | ORAL | 1 refills | Status: DC

## 2019-03-29 NOTE — Progress Notes (Signed)
Virtual / live video office visit note for [employer], D.O- Primary Care Physician at [facility]   I connected with current patient today and beyond visually recognizing the correct individual, I verified that I am speaking with the correct person using two identifiers.  . Location of the patient: Home . Location of the provider: Office Only the patient (+/- their family members at pt's discretion) and myself were participating in the encounter    - This visit type was conducted due to national recommendations for restrictions regarding the COVID-19 Pandemic (e.g. social distancing) in an effort to limit this patient's exposure and mitigate transmission in our community.  This format is felt to be most appropriate for this patient at this time.   - The patient did have access to video technology today  - No physical exam could be performed with this format, beyond that communicated to [place] by the patient/ family members as noted.   - Additionally my office staff/ schedulers discussed with the patient that there may be a monetary charge related to this service, depending on patient's medical insurance.   The patient expressed understanding, and agreed to proceed.      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  - GERD Notes his reflux "can get bad."  Says "it's not as much reflux as it is a gassy feeling all the time, right in the center right here."  Says over the weekend, he went to the beach (his girlfriend turned 40 over the weekend), and "you know I have a problem burping; I can't release gas for some reason."  Notes he "always has constant pressure in this area."  Denies pain and notes more "when I do finally burp or release that gas, it's like there no pressure at all."  Says "anything you just named [trigger foods], I don't eat."  Says he eats fish, chicken, salads.  Says his girlfriend "spoils me pretty well;  she cooks really well."  Notes he's out of his depression and gained weight, lost it again, and is "back down to 150 lbs; I feel better at that weight."  - Sleep Was on Amitriptyline for sleeping in the past and wonders the pros and cons of switching to Ambien.  Says he cannot sleep through the night and "I haven't been able to since the separation."  Says "I don't know why, but I can't sleep through the night; I can't get a good night's sleep."  - Migraines & Stress Mediated Sx after Divorce Notes "all that stuff has disappeared."  States "since the divorce was final, I don't get migraines anymore."  "Maybe it was blood pressure related, I'm not sure."  Says "since that, I've really gotten better; I've had minor headaches, but nothing major."  - Chronic Knee, Hand, Back Pain Notes has been out of meloxicam for several weeks and "can feel it coming back in the middle fingers and ring fingers."  Says "it even seems to help my knee that had the surgery too."  Says "that shark bite meniscus that he fixed has come out again, so it's floating around in my knee right now and I know I'm going to need another surgery."  Says "pain only bothers me when I'm using my hand successively, and that's usually only a couple of times per week."  - Family Notes son is now a Emergency planning/management officer in [place].  He  has been dating the same girl for three years and "getting out of daddy's pocket, which is a good thing."  Daughter is doing great; says "she was having some problems there for a little while with bullying and depression a little bit, got her some counseling and she's straightened up and doing well with school."    Depression screen [facility] 2/9 03/29/2019 07/23/2018 01/06/2018 07/15/2017 05/26/2017  Decreased Interest 0 0 0 0 2  Down, Depressed, Hopeless 0 0 0 0 1  PHQ - 2 Score 0 0 0 0 3  Altered sleeping 3 1 - 1 2  Tired, decreased energy 0 0 - 0 1  Change in appetite 0 0 - 0 0  Feeling bad or failure about  yourself  0 0 - 0 0  Trouble concentrating 0 0 - 0 0  Moving slowly or fidgety/restless 0 0 - 0 0  Suicidal thoughts 0 0 - 0 0  PHQ-9 Score 3 1 - 1 6  Difficult doing work/chores Not difficult at all - - Not difficult at all Not difficult at all    GAD 7 : Generalized Anxiety Score 05/26/2017  Nervous, Anxious, on Edge 0  Control/stop worrying 1  Worry too much - different things 0  Trouble relaxing 0  Restless 0  Easily annoyed or irritable 0  Afraid - awful might happen 0  Total GAD 7 Score 1  Anxiety Difficulty Not difficult at all     Impression and Recommendations:    1. Insomnia, unspecified type   2. Gastroesophageal reflux disease, unspecified whether esophagitis present   3. Acute reaction to stress   4. Tenosynovitis of fingers- bilateral second and third digits   5. Generalized OA- esp hands   6. Rupture of anterior cruciate ligament of left knee, sequela   7. Right sciatic nerve pain   8. Other migraine without status migrainosus, not intractable     - Last OV was 07/23/2018.  Tenosynovitis of Fingers; Bilateral Second & Third Digits, General OA, Knee Pain - Managed on Mobic. - Discussed risks and benefits of use of NSAID's with patient today. - Extensive education provided and all questions answered. - Patient understands risks and benefits of NSAID use.  - Reviewed that long-term use of NSAID's may make the patient more prone to gastritis, inflammation of the stomach and esophagus, and potentially cause bleeding of the GI tract.  - Advised patient to try not to take the Meloxicam daily, to use prudently for pain and avoid taking it whenever his pain is not severe.  - Will continue to monitor.  GERD - Worsening - Discussed importance of avoiding triggers for acid reflux, such as chronic use of NSAID's, and certain foods and drinks.  Extensively reviewed trigger foods and ingestibles with patient today.  - Encouraged patient to use OTC relief such as  Bean-O to decrease gas PRN.  - Reviewed that if patient is experiencing ongoing heartburn, reflux, and stomach pain, this may be different than gas, and of more concern.  Discussed that if Prilosec is not alleviating the patient's pain as expected, he will need to be sent for further evaluation PRN.  - Omeprazole increased to twice daily.  See med list. - Discussed that this prescription is not meant to be used twice daily for chronic management.  - If patient is needing to use the Prilosec regularly twice daily to maintain quality of life, patient promises to let us know here at the clinic, and agrees to seek  further evaluation.  Discussed that with ongoing sx of heartburn, there may be cellular changes in the GI tract that require further intensive assessment.  - Extensive education provided and all questions answered. - Will continue to monitor closely.  Insomnia; Acute Reaction to Stress - Per pt, has had sleeplessness ever since his divorce.  - Reviewed with patient today that chronic use of sleep aids such as Ambien can lead to long-term memory issues.  Discussed that if pain is the primary cause of the patient's sleeplessness, Amitriptyline/Elavil is one of the best medications for sleeplessness secondary to pain.  - Discussed if patient is having trouble with sleeping in general, Trazodone would be better.  - Per pt, having issues staying asleep. - Trazodone provided today.  See med list. - Extensive education provided and all questions answered.  - Will continue to monitor.  Migraine w/out Migrainosus - Now resolved. - Will continue to monitor.  Right Sciatic Nerve Pain - Now resolved. - Elavil discontinued. - Will continue to monitor.  Recommendations - Patient is overdue for full fasting lab work. - Last labs drawn February of 2019. - Reviewed importance of returning as advised for CPE and labs.  - Patient knows to call in with any concerns about sx or changes to  treatment plan.  - As part of my medical decision making, I reviewed the following data within the electronic MEDICAL RECORD NUMBER History obtained from pt /family, CMA notes reviewed and incorporated if applicable, Labs reviewed, Radiograph/ tests reviewed if applicable and OV notes from prior OV's with me, as well as other specialists she/he has seen since seeing me last, were all reviewed and used in my medical decision making process today.   - Additionally, discussion had with patient regarding txmnt plan, their biases about that plan etc were used in my medical decision making today.   - The patient agreed with the plan and demonstrated an understanding of the instructions.   No barriers to understanding were identified.   - Red flag symptoms and signs discussed in detail.  Patient expressed understanding regarding what to do in case of emergency\ urgent symptoms.  The patient was advised to call back or seek an in-person evaluation if the symptoms worsen or if the condition fails to improve as anticipated.   Return for 26mo or so for CPE with [facility] same day(also started trazadone this OV& inc [facility] Rife med).    Meds ordered this encounter  Medications  . meloxicam (MOBIC) 15 MG tablet    Sig: Take 1 tablet (15 mg total) by mouth daily.    Dispense:  90 tablet    Refill:  1  . omeprazole (PRILOSEC) 20 MG capsule    Sig: Take 1 capsule (20 mg total) by mouth 2 (two) times daily before a meal.    Dispense:  180 capsule    Refill:  1  . traZODone (DESYREL) 50 MG tablet    Sig: Take 1-2 tablets (50-100 mg total) by mouth at bedtime as needed for sleep.    Dispense:  180 tablet    Refill:  0    Medications Discontinued During This Encounter  Medication Reason  . predniSONE (DELTASONE) 20 MG tablet Completed Course  . amitriptyline (ELAVIL) 25 MG tablet   . ibuprofen (ADVIL,MOTRIN) 200 MG tablet   . meloxicam (MOBIC) 15 MG tablet Reorder  . omeprazole (PRILOSEC) 20 MG capsule Reorder       Note:  This note was prepared with assistance of Dragon  voice recognition software. Occasional wrong-word or sound-a-like substitutions may have occurred due to the inherent limitations of voice recognition software.  This document serves as a record of services personally performed by Thomasene Loteborah Emmalyn Hinson, DO. It was created on her behalf by Peggye FothergillKatherine Galloway, a trained medical scribe. The creation of this record is based on the scribe's personal observations and the provider's statements to them.   I have reviewed the above medical documentation for accuracy and completeness and I concur.  Thomasene Loteborah Reiley Keisler, DO 03/30/2019 1:42 PM         Patient Care Team    Relationship Specialty Notifications Start End  Thomasene Lotpalski, Sally Reimers, DO PCP - General Family Medicine  01/09/16     -Vitals obtained; medications/ allergies reconciled;  personal medical, social, Sx etc.histories were updated by CMA, reviewed by me and are reflected in chart  Patient Active Problem List   Diagnosis Date Noted  . Migraine 03/29/2019  . Generalized OA- esp hands 05/26/2017  . Insomnia 12/16/2016  . Tenosynovitis of fingers- bilateral second and third digits 12/16/2016  . Acute reaction to stress 12/16/2016  . Total bilirubin, elevated 11/07/2016  . Stress due to marital conflict 02/02/2016  . Counseling on health promotion and disease prevention 02/02/2016  . Elevated blood pressure reading without diagnosis of hypertension 02/02/2016  . GERD (gastroesophageal reflux disease) 01/12/2016  . H/O motion sickness/ seasickness 01/12/2016  . Sciatic nerve pain- R 01/09/2016  . Chronic lower back pain 01/09/2016  . Right lumbar radiculopathy 01/09/2016  . h/o Left ACL tear- s/p repair. 12/08/2014     Current Meds  Medication Sig  . meloxicam (MOBIC) 15 MG tablet Take 1 tablet (15 mg total) by mouth daily.  Marland Kitchen. omeprazole (PRILOSEC) 20 MG capsule Take 1 capsule (20 mg total) by mouth 2 (two) times daily before a meal.   . [DISCONTINUED] amitriptyline (ELAVIL) 25 MG tablet TAKE 2 DAILY AT BEDTIME WHEN NECESSARY INSOMNIA OR PAIN. PATIENT NEEDS OFFICE VISIT FOR REFILLS.  . [DISCONTINUED] ibuprofen (ADVIL,MOTRIN) 200 MG tablet Take 200 mg by mouth every 6 (six) hours as needed. Takes 4 tablets as needed  . [DISCONTINUED] meloxicam (MOBIC) 15 MG tablet Take 1 tablet (15 mg total) by mouth daily.  . [DISCONTINUED] omeprazole (PRILOSEC) 20 MG capsule TAKE 1 CAPSULE BY MOUTH EVERY DAY     Allergies  Allergen Reactions  . Oxycodone Nausea And Vomiting     ROS:  See above HPI for pertinent positives and negatives   Objective:   Blood pressure 120/82, weight 150 lb (68 kg).  (if some vitals are omitted, this means that patient was UNABLE to obtain them even though they were asked to get them prior to OV today.  They were asked to call us at their earliest convenience with these once obtained.)  General: A & O * 3; visually in no acute distress; in usual state of health.  Skin: Visible skin appears normal and pt's usual skin color HEENT:  EOMI, head is normocephalic and atraumatic.  Sclera are anicteric. Neck has a good range of motion.  Lips are noncyanotic Chest: normal chest excursion and movement Respiratory: speaking in full sentences, no conversational dyspnea; no use of accessory muscles Psych: insight good, mood- appears full

## 2019-06-01 ENCOUNTER — Other Ambulatory Visit: Payer: Self-pay

## 2019-06-01 ENCOUNTER — Ambulatory Visit (INDEPENDENT_AMBULATORY_CARE_PROVIDER_SITE_OTHER): Payer: BC Managed Care – PPO | Admitting: Family Medicine

## 2019-06-01 ENCOUNTER — Encounter: Payer: Self-pay | Admitting: Family Medicine

## 2019-06-01 VITALS — BP 115/79 | HR 60 | Temp 96.8°F | Ht 65.5 in | Wt 150.0 lb

## 2019-06-01 DIAGNOSIS — Z7189 Other specified counseling: Secondary | ICD-10-CM | POA: Diagnosis not present

## 2019-06-01 DIAGNOSIS — R1011 Right upper quadrant pain: Secondary | ICD-10-CM

## 2019-06-01 DIAGNOSIS — K219 Gastro-esophageal reflux disease without esophagitis: Secondary | ICD-10-CM | POA: Diagnosis not present

## 2019-06-01 DIAGNOSIS — R140 Abdominal distension (gaseous): Secondary | ICD-10-CM

## 2019-06-01 DIAGNOSIS — R141 Gas pain: Secondary | ICD-10-CM

## 2019-06-01 DIAGNOSIS — R142 Eructation: Secondary | ICD-10-CM

## 2019-06-01 MED ORDER — OMEPRAZOLE 40 MG PO CPDR: 40.0000 mg | DELAYED_RELEASE_CAPSULE | Freq: Two times a day (BID) | ORAL | 0 refills | Status: DC

## 2019-06-01 MED ORDER — FAMOTIDINE 20 MG PO TABS: 20.0000 mg | ORAL_TABLET | Freq: Two times a day (BID) | ORAL | 1 refills | Status: DC

## 2019-06-01 NOTE — Progress Notes (Signed)
Virtual / live video office visit note for Marsh & McLennanDeborah Keiondre Colee, D.O- [facility]   I connected with current patient today and beyond visually recognizing the correct individual, I verified that I am speaking with the correct person using two identifiers.  . Location of the patient: Home . Location of the provider: Office Only the patient (+/- their family members at pt's discretion) and myself were participating in the encounter    - This visit type was conducted due to national recommendations for restrictions regarding the COVID-19 Pandemic (e.g. social distancing) in an effort to limit this patient's exposure and mitigate transmission in our community.  This format is felt to be most appropriate for this patient at this time.   - The patient did have access to video technology today  - No physical exam could be performed with this format, beyond that communicated to us by the patient/ family members as noted.   - Additionally my office staff/ schedulers discussed with the patient that there may be a monetary charge related to this service, depending on patient's medical insurance.   The patient expressed understanding, and agreed to proceed.      History of Present Illness:   CC: Abdominal Pain (middle sternum pain. Constant dull pain with occasional stabbing pain. Pain scale 3 but with stabbing pain goes to a 6. Going on for 2 weeks. Burping alot and pressure relieves after burping.)  He had these symptoms in the past, and they went away for a month prior to current episode.   Describes the pain as "sternum area, by the stomach, in-between the ribs, stomach-wise."  He thought his symptoms were gas-related at first, because "every time I burp, it alleviates the pressure that I'm feeling.  Sometimes it's more intense than other times."  Pain is worse at night when he gets into the bed.  Was feeling okay yesterday and feeling better today, "but there's still a  dull ache there."  Today "it's almost like someone's got their hand there, lightly touching it, but not a pain."  "I feel better today than I have in two weeks."  Denies sharp, stabbing, or acidic pain.  Denies change in stools.  Denies nausea/vomiting aside from one acute incident.  Denies radiation of pain.  Notes 90% of the time, it's a dull ache at a 3 or a 4 in terms of pain.  The pressure/pain gets up to about a 6 at times.  Notes he's been burping a lot lately.  Every time he burps, his pain subsides "just a little bit, for about a period of a half an hour to an hour, depending on how big the burp is."  He isn't sure why this is the case.  Notes "I wouldn't set up an appointment unless I was concerned."  Says "I'm concerned that it's intestinal."  Believes these symptoms have been more "intense" for the last week.  "I've had a dull here-and-there pain for about two weeks."  Over the last couple of days before today, sx were bad enough that the patient felt he needed to see a doctor.  For GERD, he takes omeprazole 20 mg up to twice daily.  Notes he takes it nightly for sure, "but only on occasion do I take it in the morning."  He's been taking omeprazole nightly for months.  Notes he had week-old chicken pie last night, but this did not aggravate his symptoms.  He ate some sausages  recently that he knew were bad; "they looked a little funky and grey while I cooked them."  Notes he was "bending over with pain an hour later and threw it up."  States "those sausages really did a number on me."  Thinks his symptoms were worse the other night after eating [place] food, and another time recently after lasagna.  "I'm worried that it's not going away; that's what I'm worried about."    Depression screen [facility] 2/9 03/29/2019 07/23/2018 01/06/2018 07/15/2017 05/26/2017  Decreased Interest 0 0 0 0 2  Down, Depressed, Hopeless 0 0 0 0 1  PHQ - 2 Score 0 0 0 0 3  Altered sleeping 3 1 - 1 2  Tired,  decreased energy 0 0 - 0 1  Change in appetite 0 0 - 0 0  Feeling bad or failure about yourself  0 0 - 0 0  Trouble concentrating 0 0 - 0 0  Moving slowly or fidgety/restless 0 0 - 0 0  Suicidal thoughts 0 0 - 0 0  PHQ-9 Score 3 1 - 1 6  Difficult doing work/chores Not difficult at all - - Not difficult at all Not difficult at all    GAD 7 : Generalized Anxiety Score 05/26/2017  Nervous, Anxious, on Edge 0  Control/stop worrying 1  Worry too much - different things 0  Trouble relaxing 0  Restless 0  Easily annoyed or irritable 0  Afraid - awful might happen 0  Total GAD 7 Score 1  Anxiety Difficulty Not difficult at all     Impression and Recommendations:    1. Right upper quadrant abdominal pain   2. Gastroesophageal reflux disease, unspecified whether esophagitis present   3. Flatulence/gas pain/belching   4. Counseling on health promotion and disease prevention     GERD, R Upper Quadrant Abdominal Pain - Reviewed patient's symptoms at length during appointment today.  Discussed all possible etiologies with patient regarding his symptoms from reflux to cholestasis liver disease overproduction of gas in the bowel etc.  - Discussed that fatty or spicy meals may cause GI symptoms to worsen.  Discussed foods that could make reflux symptoms worse, as well as gaseous pain and also cholestasis symptoms worse - Patient knows to monitor for foods that exacerbate symptoms.  Encouraged patient to begin a food journal at home to closely track his intake.  - Education provided to patient today regarding GERD tmxnt.  - Continue PPI omeprazole.  Higher dose provided for short-term use ONLY. - Begin additional Rx, H2 blocker- short term.  See med list.  - Patient overdue for lab work.  Last labs drawn February of 2019. - Return for lab work near future to assess liver and gallbladder etc  - HIDA scan ordered for further evaluation of GB.  See orders.  - Patient will provide stool  sample in near future to test for H. Pylori.  - If after 2-4 weeks of treatment, patient continues having symptoms, he will need to go to GI to obtain further assessment and imaging / scope as needed.  - Patient knows to call in if symptoms worsen despite treatment, to receive referral to gastroenterology.  - As part of my medical decision making, I reviewed the following data within the [facility] History obtained from pt /family, CMA notes reviewed and incorporated if applicable, Labs reviewed, Radiograph/ tests reviewed if applicable and OV notes from prior OV's with me, as well as other specialists she/he has seen since  seeing me last, were all reviewed and used in my medical decision making process today.   - Additionally, discussion had with patient regarding txmnt plan, their biases about that plan etc were used in my medical decision making today.   - The patient agreed with the plan and demonstrated an understanding of the instructions.   No barriers to understanding were identified.    - Red flag symptoms and signs discussed in detail.  Patient expressed understanding regarding what to do in case of emergency\ urgent symptoms.  The patient was advised to call back or seek an in-person evaluation if the symptoms worsen or if the condition fails to improve as anticipated.   Return for  lab work, stool sample H pylori near future; then CPE near future in 2021.    Orders Placed This Encounter  Procedures  . NM Hepato W/Eject Fract  . H. pylori antigen, stool  . Comprehensive metabolic panel  . CBC with Diff/Plt  . TSH  . T4, free- Future  . Magnesium  . Phosphorus  . GGT - liver  . Bilirubin,Direct/Indirect/Total(Fractionated)  . Hemoglobin A1c  . Lactate Dehydrogenase (LDH)    Meds ordered this encounter  Medications  . omeprazole (PRILOSEC) 40 MG capsule    Sig: Take 1 capsule (40 mg total) by mouth 2 (two) times daily before a meal for 28 days.     Dispense:  56 capsule    Refill:  0    Do not RF this dose---> this is short term txmnt only,  . famotidine (PEPCID) 20 MG tablet    Sig: Take 1 tablet (20 mg total) by mouth 2 (two) times daily.    Dispense:  60 tablet    Refill:  1    Medications Discontinued During This Encounter  Medication Reason  . omeprazole (PRILOSEC) 20 MG capsule Reorder      Note:  This note was prepared with assistance of Dragon voice recognition software. Occasional wrong-word or sound-a-like substitutions may have occurred due to the inherent limitations of voice recognition software.  This document serves as a record of services personally performed by Thomasene Lot, DO. It was created on her behalf by [name], a trained medical scribe. The creation of this record is based on the scribe's personal observations and the provider's statements to them.   This case required medical decision making of at least moderate complexity. The above documentation has been reviewed to be accurate and was completed by [name], D.O.      Patient Care Team    Relationship Specialty Notifications Start End  Thomasene Lot, DO PCP - General Family Medicine  01/09/16     -Vitals obtained; medications/ allergies reconciled;  personal medical, social, Sx etc.histories were updated by CMA, reviewed by me and are reflected in chart  Patient Active Problem List   Diagnosis Date Noted  . Migraine 03/29/2019  . Generalized OA- esp hands 05/26/2017  . Insomnia 12/16/2016  . Tenosynovitis of fingers- bilateral second and third digits 12/16/2016  . Acute reaction to stress 12/16/2016  . Total bilirubin, elevated 11/07/2016  . Stress due to marital conflict 02/02/2016  . Counseling on health promotion and disease prevention 02/02/2016  . Elevated blood pressure reading without diagnosis of hypertension 02/02/2016  . GERD (gastroesophageal reflux disease) 01/12/2016  . H/O motion sickness/  seasickness 01/12/2016  . Sciatic nerve pain- R 01/09/2016  . Chronic lower back pain 01/09/2016  . Right lumbar radiculopathy 01/09/2016  . h/o Left  ACL tear- s/p repair. 12/08/2014     Current Meds  Medication Sig  . meloxicam (MOBIC) 15 MG tablet Take 1 tablet (15 mg total) by mouth daily.  Marland Kitchen omeprazole (PRILOSEC) 40 MG capsule Take 1 capsule (40 mg total) by mouth 2 (two) times daily before a meal for 28 days.  . SUMAtriptan (IMITREX) 20 MG/ACT nasal spray 1 spray into nostril at onset of migraine. May repeat in 2 hours if headache persists or recurs. ( max 40mg )  . traZODone (DESYREL) 50 MG tablet Take 1-2 tablets (50-100 mg total) by mouth at bedtime as needed for sleep.  . [DISCONTINUED] omeprazole (PRILOSEC) 20 MG capsule Take 1 capsule (20 mg total) by mouth 2 (two) times daily before a meal.     Allergies  Allergen Reactions  . Oxycodone Nausea And Vomiting     ROS:  See above HPI for pertinent positives and negatives   Objective:   Blood pressure 115/79, pulse 60, temperature (!) 96.8 F (36 C), temperature source Oral, height 5' 5.5" (1.664 m), weight 150 lb (68 kg).  (if some vitals are omitted, this means that patient was UNABLE to obtain them even though they were asked to get them prior to OV today.  They were asked to call Derl Barrow at their earliest convenience with these once obtained.)  General: A & O * 3; visually in no acute distress; in usual state of health.  Skin: Visible skin appears normal and pt's usual skin color HEENT:  EOMI, head is normocephalic and atraumatic.  Sclera are anicteric. Neck has a good range of motion.  Lips are noncyanotic Chest: normal chest excursion and movement Respiratory: speaking in full sentences, no conversational dyspnea; no use of accessory muscles Psych: insight good, mood- appears full

## 2019-06-07 ENCOUNTER — Telehealth: Payer: Self-pay | Admitting: Family Medicine

## 2019-06-07 NOTE — Telephone Encounter (Signed)
Call [employer] to double check for PA. AS, CMA

## 2019-06-07 NOTE — Telephone Encounter (Signed)
-----   Message from [facility] sent at 06/01/2019 10:16 AM EST ----- Regarding: PA for HIDA scan Patient is sch at [facility] ([place] on 06/14/19 for this HIDA scan, I had Heather see if it needed a PA online but his plan doesn't support that feature. She said typically commercial [employer] plans don't need a PA for this study but to call and be sure. The CPT code for this study is [id number].

## 2019-06-10 NOTE — Telephone Encounter (Signed)
Prior approval is not needed for outpatient imaging. AS, CMA

## 2019-06-14 ENCOUNTER — Ambulatory Visit (HOSPITAL_COMMUNITY)
Admission: RE | Admit: 2019-06-14 | Discharge: 2019-06-14 | Disposition: A | Discharge status: Home / Self Care | Payer: BLUE CROSS/BLUE SHIELD | Source: Ambulatory Visit | Attending: Family Medicine | Admitting: Family Medicine

## 2019-06-14 ENCOUNTER — Other Ambulatory Visit: Payer: Self-pay

## 2019-06-14 DIAGNOSIS — R1010 Upper abdominal pain, unspecified: Secondary | ICD-10-CM | POA: Diagnosis not present

## 2019-06-14 DIAGNOSIS — R1011 Right upper quadrant pain: Secondary | ICD-10-CM | POA: Insufficient documentation

## 2019-06-14 MED ORDER — TECHNETIUM TC 99M MEBROFENIN IV KIT
5.1000 | PACK | Freq: Once | INTRAVENOUS | Status: AC
  Administered 2019-06-14: 5.1 via INTRAVENOUS

## 2019-06-17 ENCOUNTER — Telehealth: Payer: Self-pay | Admitting: Family Medicine

## 2019-06-17 ENCOUNTER — Other Ambulatory Visit: Payer: Self-pay

## 2019-06-17 ENCOUNTER — Other Ambulatory Visit: Payer: BC Managed Care – PPO

## 2019-06-17 DIAGNOSIS — K219 Gastro-esophageal reflux disease without esophagitis: Secondary | ICD-10-CM | POA: Diagnosis not present

## 2019-06-17 DIAGNOSIS — R1011 Right upper quadrant pain: Secondary | ICD-10-CM | POA: Diagnosis not present

## 2019-06-17 DIAGNOSIS — R140 Abdominal distension (gaseous): Secondary | ICD-10-CM

## 2019-06-17 NOTE — Telephone Encounter (Signed)
Patient called to inquire than if & when GI referral appt made to schedule  with a GI specialist in [facility] & that is (In Network w/ [employer]).  --Forwarding message to CMA & Referral Coord.  --glh

## 2019-06-17 NOTE — Telephone Encounter (Signed)
Per Dr. Sharee Holster note from 06/01/19 -- Patient knows to call in if symptoms worsen despite treatment, to receive referral to gastroenterology.Steve [name] referral has been placed. Patient is aware of this. AS, CMA

## 2019-06-18 LAB — COMPREHENSIVE METABOLIC PANEL
ALT: 20 IU/L (ref 0–44)
AST: 23 IU/L (ref 0–40)
Albumin/Globulin Ratio: 1.7 (ref 1.2–2.2)
Albumin: 4.3 g/dL (ref 4.0–5.0)
Alkaline Phosphatase: 81 IU/L (ref 39–117)
BUN/Creatinine Ratio: 12 (ref 9–20)
BUN: 14 mg/dL (ref 6–24)
Bilirubin Total: 0.9 mg/dL (ref 0.0–1.2)
CO2: 23 mmol/L (ref 20–29)
Calcium: 9.5 mg/dL (ref 8.7–10.2)
Chloride: 105 mmol/L (ref 96–106)
Creatinine, Ser: 1.18 mg/dL (ref 0.76–1.27)
GFR calc Af Amer: 84 mL/min/{1.73_m2} (ref >59)
GFR calc non Af Amer: 73 mL/min/{1.73_m2} (ref >59)
Globulin, Total: 2.6 g/dL (ref 1.5–4.5)
Glucose: 92 mg/dL (ref 65–99)
Potassium: 4.5 mmol/L (ref 3.5–5.2)
Sodium: 141 mmol/L (ref 134–144)
Total Protein: 6.9 g/dL (ref 6.0–8.5)

## 2019-06-18 LAB — BILIRUBIN, FRACTIONATED(TOT/DIR/INDIR)
Bilirubin, Direct: 0.2 mg/dL (ref 0.00–0.40)
Bilirubin, Indirect: 0.7 mg/dL (ref 0.10–0.80)

## 2019-06-18 LAB — CBC WITH DIFFERENTIAL/PLATELET
Basophils Absolute: 0 10*3/uL (ref 0.0–0.2)
Basos: 1 %
EOS (ABSOLUTE): 0.1 10*3/uL (ref 0.0–0.4)
Eos: 2 %
Hematocrit: 47.4 % (ref 37.5–51.0)
Hemoglobin: 16 g/dL (ref 13.0–17.7)
Immature Grans (Abs): 0 10*3/uL (ref 0.0–0.1)
Immature Granulocytes: 0 %
Lymphocytes Absolute: 1.8 10*3/uL (ref 0.7–3.1)
Lymphs: 36 %
MCH: 28.7 pg (ref 26.6–33.0)
MCHC: 33.8 g/dL (ref 31.5–35.7)
MCV: 85 fL (ref 79–97)
Monocytes Absolute: 0.5 10*3/uL (ref 0.1–0.9)
Monocytes: 10 %
Neutrophils Absolute: 2.6 10*3/uL (ref 1.4–7.0)
Neutrophils: 51 %
Platelets: 223 10*3/uL (ref 150–450)
RBC: 5.58 x10E6/uL (ref 4.14–5.80)
RDW: 13.1 % (ref 11.6–15.4)
WBC: 5 10*3/uL (ref 3.4–10.8)

## 2019-06-18 LAB — T4, FREE: Free T4: 1.21 ng/dL (ref 0.82–1.77)

## 2019-06-18 LAB — TSH: TSH: 1.95 u[IU]/mL (ref 0.450–4.500)

## 2019-06-18 LAB — GAMMA GT: GGT: 20 IU/L (ref 0–65)

## 2019-06-18 LAB — PHOSPHORUS: Phosphorus: 3 mg/dL (ref 2.8–4.1)

## 2019-06-18 LAB — HEMOGLOBIN A1C
Est. average glucose Bld gHb Est-mCnc: 103 mg/dL
Hgb A1c MFr Bld: 5.2 % (ref 4.8–5.6)

## 2019-06-18 LAB — LACTATE DEHYDROGENASE: LDH: 153 IU/L (ref 121–224)

## 2019-06-18 LAB — MAGNESIUM: Magnesium: 2.2 mg/dL (ref 1.6–2.3)

## 2019-06-21 ENCOUNTER — Other Ambulatory Visit: Payer: Self-pay | Admitting: Family Medicine

## 2019-06-21 DIAGNOSIS — G4700 Insomnia, unspecified: Secondary | ICD-10-CM

## 2019-06-21 DIAGNOSIS — F430 Acute stress reaction: Secondary | ICD-10-CM

## 2019-06-22 NOTE — Telephone Encounter (Signed)
Patient was originally prescribed Trazodone 03/2019 and advised to follow up in 3 months. Patient was seen 05/2019 for GERD and has a physical appointment scheduled for 07/2019. Please advise if refill is appropriate. AS, CMA

## 2019-06-27 ENCOUNTER — Other Ambulatory Visit: Payer: Self-pay | Admitting: Family Medicine

## 2019-06-27 DIAGNOSIS — K219 Gastro-esophageal reflux disease without esophagitis: Secondary | ICD-10-CM

## 2019-06-29 ENCOUNTER — Other Ambulatory Visit: Payer: Self-pay | Admitting: Family Medicine

## 2019-07-15 ENCOUNTER — Encounter: Payer: Self-pay | Admitting: Family Medicine

## 2019-07-28 ENCOUNTER — Ambulatory Visit (INDEPENDENT_AMBULATORY_CARE_PROVIDER_SITE_OTHER): Payer: BC Managed Care – PPO | Admitting: Family Medicine

## 2019-07-28 ENCOUNTER — Encounter: Payer: Self-pay | Admitting: Family Medicine

## 2019-07-28 ENCOUNTER — Encounter: Payer: Self-pay | Admitting: Gastroenterology

## 2019-07-28 ENCOUNTER — Other Ambulatory Visit: Payer: Self-pay

## 2019-07-28 VITALS — BP 121/78 | HR 62 | Temp 98.2°F | Resp 10 | Ht 65.0 in | Wt 149.3 lb

## 2019-07-28 DIAGNOSIS — K6289 Other specified diseases of anus and rectum: Secondary | ICD-10-CM

## 2019-07-28 DIAGNOSIS — Z719 Counseling, unspecified: Secondary | ICD-10-CM | POA: Diagnosis not present

## 2019-07-28 DIAGNOSIS — K219 Gastro-esophageal reflux disease without esophagitis: Secondary | ICD-10-CM

## 2019-07-28 DIAGNOSIS — Z0000 Encounter for general adult medical examination without abnormal findings: Secondary | ICD-10-CM

## 2019-07-28 NOTE — Patient Instructions (Signed)
Preventive Care, Male Preventive care refers to lifestyle choices and visits with your health care provider that can promote health and wellness. What does preventive care include?   A yearly physical exam. This is also called an annual well check.  Dental exams once or twice a year.  Routine eye exams. Ask your health care provider how often you should have your eyes checked.  Personal lifestyle choices, including: ? Daily care of your teeth and gums. ? Regular physical activity. ? Eating a healthy diet. ? Avoiding tobacco and drug use. ? Limiting alcohol use. ? Practicing safe sex. ? Taking low doses of aspirin every day. ? Taking vitamin and mineral supplements as recommended by your health care provider. What happens during an annual well check? The services and screenings done by your health care provider during your annual well check will depend on your age, overall health, lifestyle risk factors, and family history of disease. Counseling Your health care provider may ask you questions about your:  Alcohol use.  Tobacco use.  Drug use.  Emotional well-being.  Home and relationship well-being.  Sexual activity.  Eating habits.  History of falls.  Memory and ability to understand (cognition).  Work and work Statistician. Screening You may have the following tests or measurements:  Height, weight, and BMI.  Blood pressure.  Lipid and cholesterol levels. These may be checked every 5 years, or more frequently if you are over 40 years old.  Skin check.  Lung cancer screening. You may have this screening every year starting at age 42 if you have a 30-pack-year history of smoking and currently smoke or have quit within the past 15 years.  Colorectal cancer screening. All adults should have this screening starting at age 39 and continuing until age 27. You will have tests every 1-10 years, depending on your results and the type of screening test. People at  increased risk should start screening at an earlier age. Screening tests may include: ? Guaiac-based fecal occult blood testing. ? Fecal immunochemical test (FIT). ? Stool DNA test. ? Virtual colonoscopy. ? Sigmoidoscopy. During this test, a flexible tube with a tiny camera (sigmoidoscope) is used to examine your rectum and lower colon. The sigmoidoscope is inserted through your anus into your rectum and lower colon. ? Colonoscopy. During this test, a long, thin, flexible tube with a tiny camera (colonoscope) is used to examine your entire colon and rectum.  Prostate cancer screening. Recommendations will vary depending on your family history and other risks.  Hepatitis C blood test.  Hepatitis B blood test.  Sexually transmitted disease (STD) testing.  Diabetes screening. This is done by checking your blood sugar (glucose) after you have not eaten for a while (fasting). You may have this done every 1-3 years.  Abdominal aortic aneurysm (AAA) screening. You may need this if you are a current or former smoker.  Osteoporosis. You may be screened starting at age 18 if you are at high risk. Talk with your health care provider about your test results, treatment options, and if necessary, the need for more tests. Vaccines Your health care provider may recommend certain vaccines, such as:  Influenza vaccine. This is recommended every year.  Tetanus, diphtheria, and acellular pertussis (Tdap, Td) vaccine. You may need a Td booster every 10 years.  Varicella vaccine. You may need this if you have not been vaccinated.  Zoster vaccine. You may need this after age 78.  Measles, mumps, and rubella (MMR) vaccine. You may need  at least one dose of MMR if you were born in 1957 or later. You may also need a second dose. °· Pneumococcal 13-valent conjugate (PCV13) vaccine. One dose is recommended after age 65. °· Pneumococcal polysaccharide (PPSV23) vaccine. One dose is recommended after age  65. °· Meningococcal vaccine. You may need this if you have certain conditions. °· Hepatitis A vaccine. You may need this if you have certain conditions or if you travel or work in places where you may be exposed to hepatitis A. °· Hepatitis B vaccine. You may need this if you have certain conditions or if you travel or work in places where you may be exposed to hepatitis B. °· Haemophilus influenzae type b (Hib) vaccine. You may need this if you have certain risk factors. °Talk to your health care provider about which screenings and vaccines you need and how often you need them. °This information is not intended to replace advice given to you by your health care provider. Make sure you discuss any questions you have with your health care provider. °Document Released: 06/23/2015 Document Revised: 07/17/2017 Document Reviewed: 03/28/2015 °Elsevier Interactive Patient Education © 2019 [employer] ° ° ° ° ° ° ° °Preventive Care for Adults, Male °A healthy lifestyle and preventive care can promote health and wellness. Preventive health guidelines for men include the following key practices: °· A routine yearly physical is a good way to check with your health care provider about your health and preventative screening. It is a chance to share any concerns and updates on your health and to receive a thorough exam. °· Visit your dentist for a routine exam and preventative care every 6 months. Brush your teeth twice a day and floss once a day. Good oral hygiene prevents tooth decay and gum disease. °· The frequency of eye exams is based on your age, health, family medical history, use of contact lenses, and other factors. Follow your health care provider's recommendations for frequency of eye exams. °· Eat a healthy diet. Foods such as vegetables, fruits, whole grains, low-fat dairy products, and lean protein foods contain the nutrients you need without too many calories. Decrease your intake of foods high in solid fats,  added sugars, and salt. Eat the right amount of calories for you. Get information about a proper diet from your health care provider, if necessary. °· Regular physical exercise is one of the most important things you can do for your health. Most adults should get at least 150 minutes of moderate-intensity exercise (any activity that increases your heart rate and causes you to sweat) each week. In addition, most adults need muscle-strengthening exercises on 2 or more days a week. °· Maintain a healthy weight. The body mass index (BMI) is a screening tool to identify possible weight problems. It provides an estimate of body fat based on height and weight. Your health care provider can find your BMI and can help you achieve or maintain a healthy weight. For adults 20 years and older: °¨ A BMI below 18.5 is considered underweight. °¨ A BMI of 18.5 to 24.9 is normal. °¨ A BMI of 25 to 29.9 is considered overweight. °¨ A BMI of 30 and above is considered obese. °· Maintain normal blood lipids and cholesterol levels by exercising and minimizing your intake of saturated fat. Eat a balanced diet with plenty of fruit and vegetables. Blood tests for lipids and cholesterol should begin at age 20 and be repeated every 5 years. If your lipid or cholesterol   levels are high, you are over 50, or you are at high risk for heart disease, you may need your cholesterol levels checked more frequently. Ongoing high lipid and cholesterol levels should be treated with medicines if diet and exercise are not working.  If you smoke, find out from your health care provider how to quit. If you do not use tobacco, do not start.  Lung cancer screening is recommended for adults aged 71-80 years who are at high risk for developing lung cancer because of a history of smoking. A yearly low-dose CT scan of the lungs is recommended for people who have at least a 30-pack-year history of smoking and are a current smoker or have quit within the past 15  years. A pack year of smoking is smoking an average of 1 pack of cigarettes a day for 1 year (for example: 1 pack a day for 30 years or 2 packs a day for 15 years). Yearly screening should continue until the smoker has stopped smoking for at least 15 years. Yearly screening should be stopped for people who develop a health problem that would prevent them from having lung cancer treatment.  If you choose to drink alcohol, do not have more than 2 drinks per day. One drink is considered to be 12 ounces (355 mL) of beer, 5 ounces (148 mL) of wine, or 1.5 ounces (44 mL) of liquor.  Avoid use of street drugs. Do not share needles with anyone. Ask for help if you need support or instructions about stopping the use of drugs.  High blood pressure causes heart disease and increases the risk of stroke. Your blood pressure should be checked at least every 1-2 years. Ongoing high blood pressure should be treated with medicines, if weight loss and exercise are not effective.  If you are 36-60 years old, ask your health care provider if you should take aspirin to prevent heart disease.  Diabetes screening is done by taking a blood sample to check your blood glucose level after you have not eaten for a certain period of time (fasting). If you are not overweight and you do not have risk factors for diabetes, you should be screened once every 3 years starting at age 42. If you are overweight or obese and you are 20-59 years of age, you should be screened for diabetes every year as part of your cardiovascular risk assessment.  Colorectal cancer can be detected and often prevented. Most routine colorectal cancer screening begins at the age of 80 and continues through age 41. However, your health care provider may recommend screening at an earlier age if you have risk factors for colon cancer. On a yearly basis, your health care provider may provide home test kits to check for hidden blood in the stool. Use of a small camera  at the end of a tube to directly examine the colon (sigmoidoscopy or colonoscopy) can detect the earliest forms of colorectal cancer. Talk to your health care provider about this at age 18, when routine screening begins. Direct exam of the colon should be repeated every 5-10 years through age 86, unless early forms of precancerous polyps or small growths are found.  People who are at an increased risk for hepatitis B should be screened for this virus. You are considered at high risk for hepatitis B if:  You were born in a country where hepatitis B occurs often. Talk with your health care provider about which countries are considered high risk.  Your parents  were born in a high-risk country and you have not received a shot to protect against hepatitis B (hepatitis B vaccine).  You have HIV or AIDS.  You use needles to inject street drugs.  You live with, or have sex with, someone who has hepatitis B.  You are a man who has sex with other men (MSM).  You get hemodialysis treatment.  You take certain medicines for conditions such as cancer, organ transplantation, and autoimmune conditions.  Hepatitis C blood testing is recommended for all people born from [birth year] through 1965 and any individual with known risks for hepatitis C.  Practice safe sex. Use condoms and avoid high-risk sexual practices to reduce the spread of sexually transmitted infections (STIs). STIs include gonorrhea, chlamydia, syphilis, trichomonas, herpes, HPV, and human immunodeficiency virus (HIV). Herpes, HIV, and HPV are viral illnesses that have no cure. They can result in disability, cancer, and death.  If you are a man who has sex with other men, you should be screened at least once per year for:  HIV.  Urethral, rectal, and pharyngeal infection of gonorrhea, chlamydia, or both.  If you are at risk of being infected with HIV, it is recommended that you take a prescription medicine daily to prevent HIV infection. This  is called preexposure prophylaxis (PrEP). You are considered at risk if:  You are a man who has sex with other men (MSM) and have other risk factors.  You are a heterosexual man, are sexually active, and are at increased risk for HIV infection.  You take drugs by injection.  You are sexually active with a partner who has HIV.  Talk with your health care provider about whether you are at high risk of being infected with HIV. If you choose to begin PrEP, you should first be tested for HIV. You should then be tested every 3 months for as long as you are taking PrEP.  A one-time screening for abdominal aortic aneurysm (AAA) and surgical repair of large AAAs by ultrasound are recommended for men ages 42 to 30 years who are current or former smokers.  Healthy men should no longer receive prostate-specific antigen (PSA) blood tests as part of routine cancer screening. Talk with your health care provider about prostate cancer screening.  Testicular cancer screening is not recommended for adult males who have no symptoms. Screening includes self-exam, a health care provider exam, and other screening tests. Consult with your health care provider about any symptoms you have or any concerns you have about testicular cancer.  Use sunscreen. Apply sunscreen liberally and repeatedly throughout the day. You should seek shade when your shadow is shorter than you. Protect yourself by wearing long sleeves, pants, a wide-brimmed hat, and sunglasses year round, whenever you are outdoors.  Once a month, do a whole-body skin exam, using a mirror to look at the skin on your back. Tell your health care provider about new moles, moles that have irregular borders, moles that are larger than a pencil eraser, or moles that have changed in shape or color.  Stay current with required vaccines (immunizations).  Influenza vaccine. All adults should be immunized every year.  Tetanus, diphtheria, and acellular pertussis (Td,  Tdap) vaccine. An adult who has not previously received Tdap or who does not know his vaccine status should receive 1 dose of Tdap. This initial dose should be followed by tetanus and diphtheria toxoids (Td) booster doses every 10 years. Adults with an unknown or incomplete history of completing a 3-dose  immunization series with Td-containing vaccines should begin or complete a primary immunization series including a Tdap dose. Adults should receive a Td booster every 10 years.  Varicella vaccine. An adult without evidence of immunity to varicella should receive 2 doses or a second dose if he has previously received 1 dose.  Human papillomavirus (HPV) vaccine. Males aged 11-21 years who have not received the vaccine previously should receive the 3-dose series. Males aged 22-26 years may be immunized. Immunization is recommended through the age of 25 years for any male who has sex with males and did not get any or all doses earlier. Immunization is recommended for any person with an immunocompromised condition through the age of 50 years if he did not get any or all doses earlier. During the 3-dose series, the second dose should be obtained 4-8 weeks after the first dose. The third dose should be obtained 24 weeks after the first dose and 16 weeks after the second dose.  Zoster vaccine. One dose is recommended for adults aged 86 years or older unless certain conditions are present.  Measles, mumps, and rubella (MMR) vaccine. Adults born before [birth year] generally are considered immune to measles and mumps. Adults born in [birth year] or later should have 1 or more doses of MMR vaccine unless there is a contraindication to the vaccine or there is laboratory evidence of immunity to each of the three diseases. A routine second dose of MMR vaccine should be obtained at least 28 days after the first dose for students attending postsecondary schools, health care workers, or international travelers. People who received  inactivated measles vaccine or an unknown type of measles vaccine during 1963-1967 should receive 2 doses of MMR vaccine. People who received inactivated mumps vaccine or an unknown type of mumps vaccine before 1979 and are at high risk for mumps infection should consider immunization with 2 doses of MMR vaccine. Unvaccinated health care workers born before [birth year] who lack laboratory evidence of measles, mumps, or rubella immunity or laboratory confirmation of disease should consider measles and mumps immunization with 2 doses of MMR vaccine or rubella immunization with 1 dose of MMR vaccine.  Pneumococcal 13-valent conjugate (PCV13) vaccine. When indicated, a person who is uncertain of his immunization history and has no record of immunization should receive the PCV13 vaccine. All adults 62 years of age and older should receive this vaccine. An adult aged 77 years or older who has certain medical conditions and has not been previously immunized should receive 1 dose of PCV13 vaccine. This PCV13 should be followed with a dose of pneumococcal polysaccharide (PPSV23) vaccine. Adults who are at high risk for pneumococcal disease should obtain the PPSV23 vaccine at least 8 weeks after the dose of PCV13 vaccine. Adults older than 49 years of age who have normal immune system function should obtain the PPSV23 vaccine dose at least 1 year after the dose of PCV13 vaccine.  Pneumococcal polysaccharide (PPSV23) vaccine. When PCV13 is also indicated, PCV13 should be obtained first. All adults aged 40 years and older should be immunized. An adult younger than age 62 years who has certain medical conditions should be immunized. Any person who resides in a nursing home or long-term care facility should be immunized. An adult smoker should be immunized. People with an immunocompromised condition and certain other conditions should receive both PCV13 and PPSV23 vaccines. People with human immunodeficiency virus (HIV) infection  should be immunized as soon as possible after diagnosis. Immunization during chemotherapy or radiation therapy should  be avoided. Routine use of PPSV23 vaccine is not recommended for American Indians, [place] Natives, or people younger than 65 years unless there are medical conditions that require PPSV23 vaccine. When indicated, people who have unknown immunization and have no record of immunization should receive PPSV23 vaccine. One-time revaccination 5 years after the first dose of PPSV23 is recommended for people aged 19-64 years who have chronic kidney failure, nephrotic syndrome, asplenia, or immunocompromised conditions. People who received 1-2 doses of PPSV23 before age 66 years should receive another dose of PPSV23 vaccine at age 62 years or later if at least 5 years have passed since the previous dose. Doses of PPSV23 are not needed for people immunized with PPSV23 at or after age 31 years.  Meningococcal vaccine. Adults with asplenia or persistent complement component deficiencies should receive 2 doses of quadrivalent meningococcal conjugate (MenACWY-D) vaccine. The doses should be obtained at least 2 months apart. Microbiologists working with certain meningococcal bacteria, [place] recruits, people at risk during an outbreak, and people who travel to or live in countries with a high rate of meningitis should be immunized. A first-year college student up through age 80 years who is living in a residence hall should receive a dose if he did not receive a dose on or after his 16th birthday. Adults who have certain high-risk conditions should receive one or more doses of vaccine.  Hepatitis A vaccine. Adults who wish to be protected from this disease, have chronic liver disease, work with hepatitis A-infected animals, work in hepatitis A research labs, or travel to or work in countries with a high rate of hepatitis A should be immunized. Adults who were previously unvaccinated and who anticipate close  contact with an international adoptee during the first 60 days after arrival in the [place] from a country with a high rate of hepatitis A should be immunized.  Hepatitis B vaccine. Adults should be immunized if they wish to be protected from this disease, are under age 89 years and have diabetes, have chronic liver disease, have had more than one sex partner in the past 6 months, may be exposed to blood or other infectious body fluids, are household contacts or sex partners of hepatitis B positive people, are clients or workers in certain care facilities, or travel to or work in countries with a high rate of hepatitis B.  Haemophilus influenzae type b (Hib) vaccine. A previously unvaccinated person with asplenia or sickle cell disease or having a scheduled splenectomy should receive 1 dose of Hib vaccine. Regardless of previous immunization, a recipient of a hematopoietic stem cell transplant should receive a 3-dose series 6-12 months after his successful transplant. Hib vaccine is not recommended for adults with HIV infection. Preventive Service / Frequency Ages 42 to 29  Blood pressure check.** / Every 3-5 years.  Lipid and cholesterol check.** / Every 5 years beginning at age 64.  Hepatitis C blood test.** / For any individual with known risks for hepatitis C.  Skin self-exam. / Monthly.  Influenza vaccine. / Every year.  Tetanus, diphtheria, and acellular pertussis (Tdap, Td) vaccine.** / Consult your health care provider. 1 dose of Td every 10 years.  Varicella vaccine.** / Consult your health care provider.  HPV vaccine. / 3 doses over 6 months, if 57 or younger.  Measles, mumps, rubella (MMR) vaccine.** / You need at least 1 dose of MMR if you were born in [birth year] or later. You may also need a second dose.  Pneumococcal 13-valent conjugate (  PCV13) vaccine.** / Consult your health care provider.  Pneumococcal polysaccharide (PPSV23) vaccine.** / 1 to 2 doses if you smoke  cigarettes or if you have certain conditions.  Meningococcal vaccine.** / 1 dose if you are age 51 to 42 years and a Market researcher living in a residence hall, or have one of several medical conditions. You may also need additional booster doses.  Hepatitis A vaccine.** / Consult your health care provider.  Hepatitis B vaccine.** / Consult your health care provider.  Haemophilus influenzae type b (Hib) vaccine.** / Consult your health care provider. Ages 30 to 76  Blood pressure check.** / Every year.  Lipid and cholesterol check.** / Every 5 years beginning at age 38.  Lung cancer screening. / Every year if you are aged 92-80 years and have a 30-pack-year history of smoking and currently smoke or have quit within the past 15 years. Yearly screening is stopped once you have quit smoking for at least 15 years or develop a health problem that would prevent you from having lung cancer treatment.  Fecal occult blood test (FOBT) of stool. / Every year beginning at age 24 and continuing until age 51. You may not have to do this test if you get a colonoscopy every 10 years.  Flexible sigmoidoscopy** or colonoscopy.** / Every 5 years for a flexible sigmoidoscopy or every 10 years for a colonoscopy beginning at age 76 and continuing until age 32.  Hepatitis C blood test.** / For all people born from [birth year] through 1965 and any individual with known risks for hepatitis C.  Skin self-exam. / Monthly.  Influenza vaccine. / Every year.  Tetanus, diphtheria, and acellular pertussis (Tdap/Td) vaccine.** / Consult your health care provider. 1 dose of Td every 10 years.  Varicella vaccine.** / Consult your health care provider.  Zoster vaccine.** / 1 dose for adults aged 55 years or older.  Measles, mumps, rubella (MMR) vaccine.** / You need at least 1 dose of MMR if you were born in 1957 or later. You may also need a second dose.  Pneumococcal 13-valent conjugate (PCV13) vaccine.** /  Consult your health care provider.  Pneumococcal polysaccharide (PPSV23) vaccine.** / 1 to 2 doses if you smoke cigarettes or if you have certain conditions.  Meningococcal vaccine.** / Consult your health care provider.  Hepatitis A vaccine.** / Consult your health care provider.  Hepatitis B vaccine.** / Consult your health care provider.  Haemophilus influenzae type b (Hib) vaccine.** / Consult your health care provider. Ages 75 and over  Blood pressure check.** / Every year.  Lipid and cholesterol check.**/ Every 5 years beginning at age 62.  Lung cancer screening. / Every year if you are aged 79-80 years and have a 30-pack-year history of smoking and currently smoke or have quit within the past 15 years. Yearly screening is stopped once you have quit smoking for at least 15 years or develop a health problem that would prevent you from having lung cancer treatment.  Fecal occult blood test (FOBT) of stool. / Every year beginning at age 59 and continuing until age 75. You may not have to do this test if you get a colonoscopy every 10 years.  Flexible sigmoidoscopy** or colonoscopy.** / Every 5 years for a flexible sigmoidoscopy or every 10 years for a colonoscopy beginning at age 26 and continuing until age 30.  Hepatitis C blood test.** / For all people born from [birth year] through 1965 and any individual with known risks for hepatitis C.  Abdominal aortic aneurysm (AAA) screening.** / A one-time screening for ages 73 to 4 years who are current or former smokers.  Skin self-exam. / Monthly.  Influenza vaccine. / Every year.  Tetanus, diphtheria, and acellular pertussis (Tdap/Td) vaccine.** / 1 dose of Td every 10 years.  Varicella vaccine.** / Consult your health care provider.  Zoster vaccine.** / 1 dose for adults aged 106 years or older.  Pneumococcal 13-valent conjugate (PCV13) vaccine.** / 1 dose for all adults aged 16 years and older.  Pneumococcal polysaccharide (PPSV23)  vaccine.** / 1 dose for all adults aged 29 years and older.  Meningococcal vaccine.** / Consult your health care provider.  Hepatitis A vaccine.** / Consult your health care provider.  Hepatitis B vaccine.** / Consult your health care provider.  Haemophilus influenzae type b (Hib) vaccine.** / Consult your health care provider. **Family history and personal history of risk and conditions may change your health care provider's recommendations.   This information is not intended to replace advice given to you by your health care provider. Make sure you discuss any questions you have with your health care provider.   Document Released: 07/23/2001 Document Revised: 06/17/2014 Document Reviewed: 10/22/2010 Elsevier Interactive Patient Education [employer].

## 2019-07-28 NOTE — Progress Notes (Signed)
Male physical  Impression and Recommendations:    1. Encounter for wellness examination   2. Health education/counseling   3. Gastroesophageal reflux disease, unspecified whether esophagitis present   4. Decreased rectal sphincter tone      1) Anticipatory Guidance: Discussed importance of wearing a seatbelt while driving, not texting while driving;   sunscreen when outside along with skin surveillance; eating a balanced and modest diet; physical activity at least 25 minutes per day or 150 min/ week moderate to intense activity.  - Encouraged patient to engage in prudent self-skin screening habits with the assistance of his fiance.  2) Immunizations / Screenings / Labs:   All immunizations are up-to-date per recommendations or will be updated today. Patient is due for dental and vision screens which pt will schedule independently. Will obtain CBC, CMP, HgA1c, Lipid panel, TSH and vit D when fasting, if not already done recently.   - Will begin colon cancer screening at age 32.  - Recommended Hep C screening after each new tattoo.  -  Patient was referred to gastroenterology prior.  Patient agrees to follow up for his concerns as advised.  3) Health Counseling & Preventative Maintenance: - Advised patient to continue working toward exercising to improve overall mental, physical, and emotional health.    - Reviewed the "spokes of the wheel" of mood and health management.  Stressed the importance of ongoing prudent habits, including regular exercise, appropriate sleep hygiene, healthful dietary habits, and prayer/meditation to relax.  - Encouraged patient to engage in daily physical activity, especially a formal exercise routine.  Recommended that the patient eventually strive for at least 150 minutes of moderate cardiovascular activity per week according to guidelines established by the [facility].   - Healthy dietary habits encouraged, including low-carb, and high amounts of lean  protein in diet.  Discussed goal of improving nutrient density of diet through increasing intake of fruits and vegetables and decreasing saturated fats, white flour products and refined sugars.   - Patient should also consume adequate amounts of water.  - Health counseling performed.  All questions answered.   Orders Placed This Encounter  Procedures  . VITAMIN D 25 Hydroxy (Vit-D Deficiency, Fractures)  . Lipid panel    Order Specific Question:   Has the patient fasted?    Answer:   Yes  . Ambulatory referral to Gastroenterology    Referral Priority:   Routine    Referral Type:   Consultation    Referral Reason:   Specialty Services Required    Number of Visits Requested:   1    Return for f/up 49mo or sooner if issues.   Reminded pt important of f-up preventative CPE in 1 year.  Reminded pt again, this is in addition to any chronic care visits.    Gross side effects, risk and benefits, and alternatives of medications discussed with patient.  Patient is aware that all medications have potential side effects and we are unable to predict every side effect or drug-drug interaction that may occur.  Expresses verbal understanding and consents to current therapy plan and treatment regimen.  Please see AVS handed out to patient at the end of our visit for further patient instructions/ counseling done pertaining to today's office visit.   This document serves as a record of services personally performed by [name], DO. It was created on her behalf by [name], a trained medical scribe. The creation of this record is based on the scribe's  personal observations and the provider's statements to them.   This case required medical decision making of at least moderate complexity.  This case required medical decision making of at least moderate complexity. The above documentation from [name], medical scribe, has been reviewed by [name],  D.O.        Subjective:      I, [name], am serving as Neurosurgeon for [name].   CC: CPE  HPI: Steve [name] is a 49 y.o. male who presents to [name] today for a yearly health maintenance exam.      Health Maintenance Summary  - Reviewed and updated, unless pt declines services.  Last Cologuard or Colonoscopy:  Must be 49-75 yo. Family history of Colon CA: No.  Tobacco History Reviewed:  Never smoker.  Alcohol / drug use:  No concerns, no excessive use / no use Exercise Habits:  biking 5 miles, three days per week. Dental Home:  Passed out at the dentist two weeks ago; had been given nitrous to have crowns put on. Eye exams:  No concerns.  Has prescription sunglasses and trifocals for reading, computer work, and driving / seeing far away; notes "I can't see street signs and thought a squirrel was a rabbit when I was hunting." Dermatology home:  Thinks his grandfather had patches he needed frozen off.  Notes he has been to the dermatologist [name]) in the past to have things taken off.  Male history: STD concerns:  None, monogamous with fiance. Birth control method:  No concerns. Additional penile/ urinary concerns:  No concerns. Denies known family history of prostate or testicular cancer.  Denies GI concerns such as nausea, vomiting, diarrhea.  Denies pain or burning in the rectal area.  States that he continues to have a mild rectal concern when he's been doing a lot of heavy lifting at work.  Says "I don't have any bleeding with it; it's just when I do a lot of lifting,"  confirms it feels like his tissues from inside are pooching out.  Denies incontinence of stools. Notes "I go to the bathroom every day after work, pretty much like clockwork."  On occasion he has to go to the bathroom at work.  Denies straining while passing bowels; feels his stool is overall normal.  Says his stool "changes occasionally with what I  eat."  Says "everything's pretty normal."  Additional concerns beyond Health Maintenance issues:   He is currently selling his house and moving in with his fiance.  He has been putting all of his free time lately into his move.  Thinks he's having a little trouble sleeping because of his recent move.  Has a stye on his eye, but has not been using warm compresses to treat it because he did not know what to do about it.  His son is 91 and his daughter is 68.    Depression screen [facility] 2/9 07/28/2019 03/29/2019 07/23/2018 01/06/2018 07/15/2017  Decreased Interest 0 0 0 0 0  Down, Depressed, Hopeless 0 0 0 0 0  PHQ - 2 Score 0 0 0 0 0  Altered sleeping 1 3 1  - 1  Tired, decreased energy 1 0 0 - 0  Change in appetite 0 0 0 - 0  Feeling bad or failure about yourself  0 0 0 - 0  Trouble concentrating 0 0 0 - 0  Moving slowly or fidgety/restless 0 0 0 - 0  Suicidal thoughts 0  0 0 - 0  PHQ-9 Score 2 3 1  - 1  Difficult doing work/chores Not difficult at all Not difficult at all - - Not difficult at all    GAD 7 : Generalized Anxiety Score 05/26/2017  Nervous, Anxious, on Edge 0  Control/stop worrying 1  Worry too much - different things 0  Trouble relaxing 0  Restless 0  Easily annoyed or irritable 0  Afraid - awful might happen 0  Total GAD 7 Score 1  Anxiety Difficulty Not difficult at all      Immunization History  Administered Date(s) Administered  . Influenza-Unspecified 03/06/2016  . Tdap 03/21/2016     Health Maintenance  Topic Date Due  . INFLUENZA VACCINE  01/09/2019  . TETANUS/TDAP  03/21/2026  . HIV Screening  Completed       Wt Readings from Last 3 Encounters:  07/28/19 149 lb 4.8 oz (67.7 kg)  06/01/19 150 lb (68 kg)  03/29/19 150 lb (68 kg)   BP Readings from Last 3 Encounters:  07/28/19 121/78  06/01/19 115/79  03/29/19 120/82   Pulse Readings from Last 3 Encounters:  07/28/19 62  06/01/19 60  07/23/18 77    Patient Active Problem List    Diagnosis Date Noted  . Decreased rectal sphincter tone 07/28/2019  . Migraine 03/29/2019  . Generalized OA- esp hands 05/26/2017  . Insomnia 12/16/2016  . Tenosynovitis of fingers- bilateral second and third digits 12/16/2016  . Acute reaction to stress 12/16/2016  . Total bilirubin, elevated 11/07/2016  . Stress due to marital conflict 02/02/2016  . Counseling on health promotion and disease prevention 02/02/2016  . Elevated blood pressure reading without diagnosis of hypertension 02/02/2016  . GERD (gastroesophageal reflux disease) 01/12/2016  . H/O motion sickness/ seasickness 01/12/2016  . Sciatic nerve pain- R 01/09/2016  . Chronic lower back pain 01/09/2016  . Right lumbar radiculopathy 01/09/2016  . h/o Left ACL tear- s/p repair. 12/08/2014    Past Medical History:  Diagnosis Date  . Chronic lower back pain 01/09/2016  . GERD (gastroesophageal reflux disease) 01/12/2016  . h/o Left ACL tear- s/p repair. 12/08/2014  . H/O motion sickness/ seasickness 01/12/2016  . Right lumbar radiculopathy 01/09/2016  . Sciatic nerve pain- R 01/09/2016    Past Surgical History:  Procedure Laterality Date  . KNEE ARTHROSCOPY Left     Family History  Problem Relation Age of Onset  . Cancer Maternal Grandfather        bone    Social History   Substance and Sexual Activity  Drug Use No  ,  Social History   Substance and Sexual Activity  Alcohol Use No  ,  Social History   Tobacco Use  Smoking Status Never Smoker  Smokeless Tobacco Never Used  ,  Social History   Substance and Sexual Activity  Sexual Activity Yes    Patient's Medications  New Prescriptions   No medications on file  Previous Medications   FAMOTIDINE (PEPCID) 20 MG TABLET    TAKE 1 TABLET BY MOUTH TWICE A DAY   MELOXICAM (MOBIC) 15 MG TABLET    Take 1 tablet (15 mg total) by mouth daily.   OMEPRAZOLE (PRILOSEC) 40 MG CAPSULE    TAKE 1 CAPSULE (40 MG TOTAL) BY MOUTH 2 (TWO) TIMES DAILY BEFORE A MEAL FOR 28  DAYS.   SUMATRIPTAN (IMITREX) 20 MG/ACT NASAL SPRAY    1 spray into nostril at onset of migraine. May repeat in 2 hours if headache persists  or recurs. ( max 40mg )   TRAZODONE (DESYREL) 50 MG TABLET    TAKE 1-2 TABLETS (50-100 MG TOTAL) BY MOUTH AT BEDTIME AS NEEDED FOR SLEEP.  Modified Medications   No medications on file  Discontinued Medications   No medications on file    Oxycodone   Review of Systems:  A fourteen system review of systems was performed and found to be positive as per HPI.    Objective:     Blood pressure 121/78, pulse 62, temperature 98.2 F (36.8 C), temperature source Oral, resp. rate 10, height 5\' 5"  (1.651 m), weight 149 lb 4.8 oz (67.7 kg), SpO2 99 %. Body mass index is 24.84 kg/m. General Appearance:    Alert, cooperative, no distress, appears stated age  Head:    Normocephalic, without obvious abnormality, atraumatic  Eyes:    PERRL, conjunctiva/corneas clear, EOM's intact, fundi    benign, both eyes  Ears:    Normal TM's and external ear canals, both ears  Nose:   Nares normal, septum midline, mucosa normal, no drainage    or sinus tenderness  Throat:   Lips w/o lesion, mucosa moist, and tongue normal; teeth and   gums normal  Neck:   Supple, symmetrical, trachea midline, no adenopathy;    thyroid:  no enlargement/tenderness/nodules; no carotid   bruit or JVD  Back:     Symmetric, no curvature, ROM normal, no CVA tenderness  Lungs:     Clear to auscultation bilaterally, respirations unlabored, no       Wh/ R/ R  Chest Wall:    No tenderness or gross deformity; normal excursion   Heart:    Regular rate and rhythm, S1 and S2 normal, no murmur, rub   or gallop  Abdomen:     Soft, non-tender, bowel sounds active all four quadrants, NO   G/R/R, no masses, no organomegaly  Genitalia:    Ext genitalia: without lesion, no penile rash or discharge.  No hernias appreciated, but fullness in left inguinal canal that is not appreciated in the right.   Rectal:    Normal tone, prostate WNL's and equal b/l, no tenderness; guaiac negative stool. small, non-inflamed at 6 o'clock position. No tenderness to palpation internally, no bleeding, no gross blood.  Extremities:   Extremities normal, atraumatic, no cyanosis or gross edema  Pulses:   2+ and symmetric all extremities  Skin:   Warm, dry, Skin color, texture, turgor normal, no obvious rashes or lesions  M-Sk:   Ambulates * 4 w/o difficulty, no gross deformities, tone WNL  Neurologic:   CNII-XII intact, normal strength, sensation and reflexes    Throughout Psych:  No HI/SI, judgement and insight good, Euthymic mood. Full Affect.

## 2019-07-29 LAB — VITAMIN D 25 HYDROXY (VIT D DEFICIENCY, FRACTURES): Vit D, 25-Hydroxy: 20.6 ng/mL — ABNORMAL LOW (ref 30.0–100.0)

## 2019-07-29 LAB — LIPID PANEL
Chol/HDL Ratio: 4.9 ratio (ref 0.0–5.0)
Cholesterol, Total: 200 mg/dL — ABNORMAL HIGH (ref 100–199)
HDL: 41 mg/dL (ref >39)
LDL Chol Calc (NIH): 140 mg/dL — ABNORMAL HIGH (ref 0–99)
Triglycerides: 102 mg/dL (ref 0–149)
VLDL Cholesterol Cal: 19 mg/dL (ref 5–40)

## 2019-08-01 ENCOUNTER — Other Ambulatory Visit: Payer: Self-pay | Admitting: Family Medicine

## 2019-08-25 ENCOUNTER — Ambulatory Visit: Payer: BC Managed Care – PPO | Admitting: Gastroenterology

## 2019-08-25 ENCOUNTER — Encounter: Payer: Self-pay | Admitting: Gastroenterology

## 2019-08-25 VITALS — BP 114/68 | HR 58 | Temp 98.0°F | Ht 66.0 in | Wt 148.0 lb

## 2019-08-25 DIAGNOSIS — K649 Unspecified hemorrhoids: Secondary | ICD-10-CM

## 2019-08-25 DIAGNOSIS — Z01818 Encounter for other preprocedural examination: Secondary | ICD-10-CM

## 2019-08-25 DIAGNOSIS — K219 Gastro-esophageal reflux disease without esophagitis: Secondary | ICD-10-CM

## 2019-08-25 DIAGNOSIS — R1013 Epigastric pain: Secondary | ICD-10-CM

## 2019-08-25 DIAGNOSIS — R1310 Dysphagia, unspecified: Secondary | ICD-10-CM

## 2019-08-25 DIAGNOSIS — Z1211 Encounter for screening for malignant neoplasm of colon: Secondary | ICD-10-CM

## 2019-08-25 MED ORDER — SUPREP BOWEL PREP KIT 17.5-3.13-1.6 GM/177ML PO SOLN: ORAL | 0 refills | Status: DC

## 2019-08-25 NOTE — Patient Instructions (Signed)
If you are age 49 or older, your body mass index should be between 23-30. Your Body mass index is 23.89 kg/m. If this is out of the aforementioned range listed, please consider follow up with your Primary Care Provider.  If you are age 15 or younger, your body mass index should be between 19-25. Your Body mass index is 23.89 kg/m. If this is out of the aformentioned range listed, please consider follow up with your Primary Care Provider.   You have been scheduled for an endoscopy and colonoscopy. Please follow the written instructions given to you at your visit today. Please pick up your prep supplies at the pharmacy within the next 1-3 days. If you use inhalers (even only as needed), please bring them with you on the day of your procedure.  Thank you for entrusting me with your care and for choosing [facility], Dr. Ileene Patrick

## 2019-08-25 NOTE — Progress Notes (Signed)
HPI :  49 year old male with a history of chronic reflux, low back pain, headaches, referred by [name] DO for epigastric pain and reflux.  He states he developed epigastric pain towards the end of January.  States at times it was severe pain that prevented him from sleeping well when it first started.  He increased his omeprazole from 20 mg a day to 40 mg twice a day and the severe pain eventually resolved.  He has been left with pressure in his epigastric area that continues to bother him however.  He states it is mostly mild during the day.  He is sleeping okay and is not keeping him up anymore.  He describes it as a pressure that is often relieved with belching.  Denies much postprandial worsening.  He does have longstanding reflux symptoms with regurgitation and water brash.  This is been mostly managed with omeprazole 20 mg over time.  He has clear food triggers that can produce this.  He has also had some intermittent dysphagia with mostly breads and meats.  He will feel these foods occasionally gets stuck in his lower chest when he swallows before passes through.  He is missing some teeth in the back of his mouth and sometimes has a hard time chewing things well, he is not sure if it is due to that or a primary esophagus problem.  His dysphagia is been ongoing for 6 months.  He does have a history of arthritic pain in his fingers.  He takes Mobic as needed, states he takes it perhaps 2 to 3 days a week.  No other NSAID use that he endorses.  He is also selling a house in the middle of a divorce which she states has been quite stressful and wondering if that could be related to his symptoms.  He had a HIDA scan performed on January 4 which was normal.  He was referred for an H. pylori stool antigen but did not submit the specimen.  He otherwise denies any blood in his stools.  No constipation and no diarrhea.  His bowel habits are fairly regular in general, he denies straining, however he  does have prolapse of tissue with his bowel movements which he has to manually reduce.  This is becoming more problematic over time.  He has never had a prior colonoscopy.  No prior endoscopy.  He denies any family history of esophageal cancer or colon cancer.    Past Medical History:  Diagnosis Date  . Chronic lower back pain 01/09/2016  . GERD (gastroesophageal reflux disease) 01/12/2016  . h/o Left ACL tear- s/p repair. 12/08/2014  . H/O motion sickness/ seasickness 01/12/2016  . Right lumbar radiculopathy 01/09/2016  . Sciatic nerve pain- R 01/09/2016     Past Surgical History:  Procedure Laterality Date  . FOOT SURGERY    . KNEE ARTHROSCOPY Left    Family History  Problem Relation Age of Onset  . Bone cancer Maternal Grandfather   . Kidney disease Mother   . Colon cancer Neg Hx   . Stomach cancer Neg Hx   . Esophageal cancer Neg Hx   . Pancreatic cancer Neg Hx    Social History   Tobacco Use  . Smoking status: Never Smoker  . Smokeless tobacco: Never Used  Substance Use Topics  . Alcohol use: Yes    Comment: Occ beer with meals   . Drug use: No   Current Outpatient Medications  Medication Sig Dispense Refill  .  meloxicam (MOBIC) 15 MG tablet Take 1 tablet (15 mg total) by mouth daily. 90 tablet 1  . SUMAtriptan (IMITREX) 20 MG/ACT nasal spray 1 spray into nostril at onset of migraine. May repeat in 2 hours if headache persists or recurs. ( max 40mg ) 1 Inhaler 2  . omeprazole (PRILOSEC) 40 MG capsule TAKE 1 CAPSULE (40 MG TOTAL) BY MOUTH 2 (TWO) TIMES DAILY BEFORE A MEAL FOR 28 DAYS. 56 capsule 0   No current facility-administered medications for this visit.   Allergies  Allergen Reactions  . Oxycodone Nausea And Vomiting     Review of Systems: All systems reviewed and negative except where noted in HPI.   Lab Results  Component Value Date   WBC 5.0 06/17/2019   HGB 16.0 06/17/2019   HCT 47.4 06/17/2019   MCV 85 06/17/2019   PLT 223 06/17/2019    Lab  Results  Component Value Date   CREATININE 1.18 06/17/2019   BUN 14 06/17/2019   NA 141 06/17/2019   K 4.5 06/17/2019   CL 105 06/17/2019   CO2 23 06/17/2019    Lab Results  Component Value Date   ALT 20 06/17/2019   AST 23 06/17/2019   GGT 20 06/17/2019   ALKPHOS 81 06/17/2019   BILITOT 0.9 06/17/2019     Physical Exam: BP 114/68   Pulse (!) 58   Temp 98 F (36.7 C)   Ht 5\' 6"  (1.676 m)   Wt 148 lb (67.1 kg)   BMI 23.89 kg/m  Constitutional: Pleasant,well-developed, male in no acute distress. HEENT: Normocephalic and atraumatic. Conjunctivae are normal. No scleral icterus. Neck supple.  Cardiovascular: Normal rate, regular rhythm.  Pulmonary/chest: Effort normal and breath sounds normal. No wheezing, rales or rhonchi. Abdominal: Soft, nondistended, nontender. There are no masses palpable.  Extremities: no edema Lymphadenopathy: No cervical adenopathy noted. Neurological: Alert and oriented to person place and time. Skin: Skin is warm and dry. No rashes noted. Psychiatric: Normal mood and affect. Behavior is normal.   ASSESSMENT AND PLAN: 49 year old male here for new patient assessment of the following:  Epigastric pain / GERD / Dysphagia - as above, longstanding reflux symptoms that have been managed with low-dose omeprazole and generally effective however he had severe epigastric pain in recent months that responded to high-dose PPI.  He is considerably improved from his previous state however continues to have mild epigastric discomfort.  He has occasional use of NSAIDs.  Discussed differential diagnosis with him.  PUD possible however his baseline use of PPI should be protective against this.  I recommend he completely stop his NSAIDs for now.  H. pylori, reflux esophagitis, hiatal hernia etc. all possible.  Regarding his dysphagia suspect he may have a peptic stricture but need to rule out mass lesion.  Ultimately I recommend an upper endoscopy to further evaluate  these issues.  I discussed risk and benefits of endoscopy and anesthesia with him and he wanted to proceed.  We will continue twice daily PPI in the interim and hold off on all NSAIDs for now.  Pending he does not have a large hiatal hernia and he is an endoscopic candidate for TIF, we discussed alternative regimens for reflux and his preference is to come off PPI if possible.  If that is the case then we may consider referral to Dr. for TIF pending the results.  He agreed  Hemorrhoids / colon cancer screening - I suspect he probably has grade 3 prolapsing hemorrhoids based on description  of symptoms versus rectal prolapse.  Given new guideline recommendations he is due for colonoscopy at this time.  DRE deferred today as he is going to need colonoscopy.  Discussed risk and benefits he wants to do this at the same time as his endoscopy.  Pending findings, if symptoms due to hemorrhoids he may be a candidate for hemorrhoid banding to treat this and try to avoid surgery.  He agreed  Ileene Patrick, MD [facility]  CC: Thomasene Lot, DO

## 2019-08-27 ENCOUNTER — Other Ambulatory Visit: Payer: Self-pay

## 2019-08-27 ENCOUNTER — Ambulatory Visit (INDEPENDENT_AMBULATORY_CARE_PROVIDER_SITE_OTHER): Payer: BC Managed Care – PPO

## 2019-08-27 DIAGNOSIS — Z1159 Encounter for screening for other viral diseases: Secondary | ICD-10-CM | POA: Diagnosis not present

## 2019-08-28 LAB — SARS CORONAVIRUS 2 (TAT 6-24 HRS): SARS Coronavirus 2: NEGATIVE

## 2019-08-30 ENCOUNTER — Encounter: Payer: Self-pay | Admitting: Gastroenterology

## 2019-08-31 ENCOUNTER — Encounter: Payer: Self-pay | Admitting: Gastroenterology

## 2019-08-31 ENCOUNTER — Other Ambulatory Visit: Payer: Self-pay

## 2019-08-31 ENCOUNTER — Ambulatory Visit (AMBULATORY_SURGERY_CENTER): Payer: BC Managed Care – PPO | Admitting: Gastroenterology

## 2019-08-31 VITALS — BP 115/74 | HR 61 | Temp 96.9°F | Resp 11 | Ht 66.0 in | Wt 148.0 lb

## 2019-08-31 DIAGNOSIS — K449 Diaphragmatic hernia without obstruction or gangrene: Secondary | ICD-10-CM | POA: Diagnosis not present

## 2019-08-31 DIAGNOSIS — K2950 Unspecified chronic gastritis without bleeding: Secondary | ICD-10-CM

## 2019-08-31 DIAGNOSIS — R1310 Dysphagia, unspecified: Secondary | ICD-10-CM | POA: Diagnosis not present

## 2019-08-31 DIAGNOSIS — Z1211 Encounter for screening for malignant neoplasm of colon: Secondary | ICD-10-CM

## 2019-08-31 DIAGNOSIS — K219 Gastro-esophageal reflux disease without esophagitis: Secondary | ICD-10-CM | POA: Diagnosis not present

## 2019-08-31 DIAGNOSIS — K222 Esophageal obstruction: Secondary | ICD-10-CM | POA: Diagnosis not present

## 2019-08-31 DIAGNOSIS — R1013 Epigastric pain: Secondary | ICD-10-CM

## 2019-08-31 MED ORDER — SODIUM CHLORIDE 0.9 % IV SOLN: 500.0000 mL | Freq: Once | INTRAVENOUS | Status: DC

## 2019-08-31 NOTE — Progress Notes (Signed)
Called to room to assist during endoscopic procedure.  Patient ID and intended procedure confirmed with present staff. Received instructions for my participation in the procedure from the performing physician.  

## 2019-08-31 NOTE — Op Note (Signed)
[name] Patient Name: Steve [name] Procedure Date: 08/31/2019 3:49 PM MRN: [id number] Endoscopist: Viviann Spare P. Adela Lank , MD Age: 49 Referring MD:  Date of Birth: [birth year] Gender: Male Account #: [id number] Procedure:                Colonoscopy Indications:              Screening for colorectal malignant neoplasm, This                            is the patient's first colonoscopy Medicines:                Monitored Anesthesia Care Procedure:                Pre-Anesthesia Assessment:                           - Prior to the procedure, a History and Physical                            was performed, and patient medications and                            allergies were reviewed. The patient's tolerance of                            previous anesthesia was also reviewed. The risks                            and benefits of the procedure and the sedation                            options and risks were discussed with the patient.                            All questions were answered, and informed consent                            was obtained. Prior Anticoagulants: The patient has                            taken no previous anticoagulant or antiplatelet                            agents. ASA Grade Assessment: II - A patient with                            mild systemic disease. After reviewing the risks                            and benefits, the patient was deemed in                            satisfactory condition to undergo the procedure.  After obtaining informed consent, the colonoscope                            was passed under direct vision. Throughout the                            procedure, the patient's blood pressure, pulse, and                            oxygen saturations were monitored continuously. The                            Colonoscope was introduced through the anus and                            advanced to the the  terminal ileum, with                            identification of the appendiceal orifice and IC                            valve. The colonoscopy was performed without                            difficulty. The patient tolerated the procedure                            well. The quality of the bowel preparation was                            good. The terminal ileum, ileocecal valve,                            appendiceal orifice, and rectum were photographed. Scope In: 4:18:23 PM Scope Out: 4:37:26 PM Scope Withdrawal Time: 0 hours 15 minutes 34 seconds  Total Procedure Duration: 0 hours 19 minutes 3 seconds  Findings:                 The perianal and digital rectal examinations were                            normal.                           The terminal ileum appeared normal.                           Multiple medium-mouthed diverticula were found in                            the sigmoid colon.                           Internal hemorrhoids were found during  retroflexion. The hemorrhoids were moderate.                           The exam was otherwise without abnormality. Complications:            No immediate complications. Estimated blood loss:                            None. Estimated Blood Loss:     Estimated blood loss: none. Impression:               - The examined portion of the ileum was normal.                           - Diverticulosis in the sigmoid colon.                           - Internal hemorrhoids.                           - The examination was otherwise normal.                           - No polyps Recommendation:           - Patient has a contact number available for                            emergencies. The signs and symptoms of potential                            delayed complications were discussed with the                            patient. Return to normal activities tomorrow.                            Written discharge  instructions were provided to the                            patient.                           - Resume previous diet.                           - Continue present medications.                           - Repeat colonoscopy in 10 years for screening                            purposes.                           - Consideration for hemorrhoid banding if these are  causing symptoms (as discussed in clinic) Steve [name]. Steve Round, MD 08/31/2019 4:42:13 PM This report has been signed electronically.

## 2019-08-31 NOTE — Progress Notes (Signed)
PT taken to PACU. Monitors in place. VSS. Report given to RN. 

## 2019-08-31 NOTE — Patient Instructions (Signed)
HANDOUTS PROVIDED ON: POST DILATION DIET, HIATAL HERNIA, DIVERTICULOSIS & HEMORRHOIDS  The biopsies taken today have been sent for pathology.  The results can take 1-3 weeks to receive.    You may resume your previous diet tomorrow, please follow the post dilation diet today.  You may resume your normal medication schedule.  Thank you for allowing [place] to care for you today!!!   YOU HAD AN ENDOSCOPIC PROCEDURE TODAY AT THE [facility]:   Refer to the procedure report that was given to you for any specific questions about what was found during the examination.  If the procedure report does not answer your questions, please call your gastroenterologist to clarify.  If you requested that your care partner not be given the details of your procedure findings, then the procedure report has been included in a sealed envelope for you to review at your convenience later.  YOU SHOULD EXPECT: Some feelings of bloating in the abdomen. Passage of more gas than usual.  Walking can help get rid of the air that was put into your GI tract during the procedure and reduce the bloating. If you had a lower endoscopy (such as a colonoscopy or flexible sigmoidoscopy) you may notice spotting of blood in your stool or on the toilet paper. If you underwent a bowel prep for your procedure, you may not have a normal bowel movement for a few days.  Please Note:  You might notice some irritation and congestion in your nose or some drainage.  This is from the oxygen used during your procedure.  There is no need for concern and it should clear up in a day or so.  SYMPTOMS TO REPORT IMMEDIATELY:   Following lower endoscopy (colonoscopy or flexible sigmoidoscopy):  Excessive amounts of blood in the stool  Significant tenderness or worsening of abdominal pains  Swelling of the abdomen that is new, acute  Fever of 100F or higher   Following upper endoscopy (EGD)  Vomiting of blood or coffee ground material  New  chest pain or pain under the shoulder blades  Painful or persistently difficult swallowing  New shortness of breath  Fever of 100F or higher  Black, tarry-looking stools  For urgent or emergent issues, a gastroenterologist can be reached at any hour by calling [phone]. Do not use MyChart messaging for urgent concerns.    DIET:  We do recommend you follow the post dilation diet provided today and then you may proceed to your regular diet on tomorrow.  Drink plenty of fluids but you should avoid alcoholic beverages for 24 hours.  ACTIVITY:  You should plan to take it easy for the rest of today and you should NOT DRIVE or use heavy machinery until tomorrow (because of the sedation medicines used during the test).    FOLLOW UP: Our staff will call the number listed on your records 48-72 hours following your procedure to check on you and address any questions or concerns that you may have regarding the information given to you following your procedure. If we do not reach you, we will leave a message.  We will attempt to reach you two times.  During this call, we will ask if you have developed any symptoms of COVID 19. If you develop any symptoms (ie: fever, flu-like symptoms, shortness of breath, cough etc.) before then, please call [phone].  If you test positive for Covid 19 in the 2 weeks post procedure, please call and report this information to [place].  If any biopsies were taken you will be contacted by phone or by letter within the next 1-3 weeks.  Please call us at [phone] if you have not heard about the biopsies in 3 weeks.    SIGNATURES/CONFIDENTIALITY: You and/or your care partner have signed paperwork which will be entered into your electronic medical record.  These signatures attest to the fact that that the information above on your After Visit Summary has been reviewed and is understood.  Full responsibility of the confidentiality of this discharge information lies  with you and/or your care-partner.

## 2019-08-31 NOTE — Progress Notes (Signed)
Vitals-DT Temp-LC  Pt's states no medical or surgical changes since previsit or office visit. 

## 2019-08-31 NOTE — Op Note (Signed)
[facility] Patient Name: Steve SlimSamuel [name] Procedure Date: 08/31/2019 3:50 PM MRN: [id number] Endoscopist: Viviann SpareSteven P. Adela LankArmbruster , MD Age: 49 Referring MD:  Date of Birth: October 03, 1970 Gender: Male Account #: [id number] Procedure:                Upper GI endoscopy Indications:              Epigastric abdominal pain, Dysphagia, Follow-up of                            gastro-esophageal reflux disease - improved on                            omeprazole 40mg  twice daily Medicines:                Monitored Anesthesia Care Procedure:                Pre-Anesthesia Assessment:                           - Prior to the procedure, a History and Physical                            was performed, and patient medications and                            allergies were reviewed. The patient's tolerance of                            previous anesthesia was also reviewed. The risks                            and benefits of the procedure and the sedation                            options and risks were discussed with the patient.                            All questions were answered, and informed consent                            was obtained. Prior Anticoagulants: The patient has                            taken no previous anticoagulant or antiplatelet                            agents. ASA Grade Assessment: II - A patient with                            mild systemic disease. After reviewing the risks                            and benefits, the patient was deemed in  satisfactory condition to undergo the procedure.                           After obtaining informed consent, the endoscope was                            passed under direct vision. Throughout the                            procedure, the patient's blood pressure, pulse, and                            oxygen saturations were monitored continuously. The                            Endoscope was introduced  through the mouth, and                            advanced to the second part of duodenum. The upper                            GI endoscopy was accomplished without difficulty.                            The patient tolerated the procedure well. Scope In: Scope Out: Findings:                 Esophagogastric landmarks were identified: the                            Z-line was found at 38 cm, the gastroesophageal                            junction was found at 38 cm and the upper extent of                            the gastric folds was found at 40 cm from the                            incisors.                           A 2 cm hiatal hernia was present.                           The Z-line was slightly irregular / slightly                            nodular and was found 38 cm from the incisors.                            Biopsies were taken with a cold forceps for  histology.                           Mucosal changes including mild ringed esophagus and                            mild longitudinal furrows were found in the entire                            esophagus. Slight mild stenosis noted at the GEJ,                            otherwise no obvious stricture noted. Biopsies were                            obtained from the proximal and distal esophagus                            with cold forceps for histology to rule out                            eosinophilic esophagitis.                           A guidewire was placed and the scope was withdrawn.                            Dilation was performed in the entire esophagus with                            a Savary dilator with mild resistance at 17 mm and                            18 mm. Relook endoscopy showed appripriate mucosal                            wrents x 2 - one in the proximal eosphagus below                            the UES and another at the GEJ.                           The entire  examined stomach was normal. Biopsies                            were taken with a cold forceps for Helicobacter                            pylori testing given the patient's epigastric pain.                           The duodenal bulb and second portion of the  duodenum were normal. Complications:            No immediate complications. Estimated blood loss:                            Minimal. Estimated Blood Loss:     Estimated blood loss was minimal. Impression:               - Esophagogastric landmarks identified.                           - 2 cm hiatal hernia.                           - Z-line irregular, 38 cm from the incisors.                            Biopsied.                           - Esophageal mucosal changes suspicious for                            possible eosinophilic esophagitis. Biopsied.                           - Mild stenosis at GEJ - dilated to 54mm with good                            result, also noted to have mucosal wrent in                            proximal esophagus, suspect subtle stenosis there.                           - Normal stomach. Biopsied.                           - Normal duodenal bulb and second portion of the                            duodenum. Recommendation:           - Patient has a contact number available for                            emergencies. The signs and symptoms of potential                            delayed complications were discussed with the                            patient. Return to normal activities tomorrow.                            Written discharge instructions were provided to the  patient.                           - Post dilation diet                           - Continue present medications.                           - Await pathology results and course post dilation. Viviann Spare P. Dmetrius Ambs, MD 08/31/2019 4:52:34 PM This report has been signed  electronically.

## 2019-09-02 ENCOUNTER — Telehealth: Payer: Self-pay

## 2019-09-02 ENCOUNTER — Telehealth: Payer: Self-pay | Admitting: *Deleted

## 2019-09-02 NOTE — Telephone Encounter (Signed)
Second follow up call attempt.  No Answer, message left earlier this morning.

## 2019-09-02 NOTE — Telephone Encounter (Signed)
Attempted to reach patient for post-procedure f/u call. No answer. Left message that we will make another attempt to reach him again later today and for him to please not hesitate to call us if he has any questions/concerns regarding his care. 

## 2019-10-02 ENCOUNTER — Other Ambulatory Visit: Payer: Self-pay | Admitting: Family Medicine

## 2019-10-02 DIAGNOSIS — K219 Gastro-esophageal reflux disease without esophagitis: Secondary | ICD-10-CM

## 2019-10-02 DIAGNOSIS — S83512S Sprain of anterior cruciate ligament of left knee, sequela: Secondary | ICD-10-CM

## 2019-10-02 DIAGNOSIS — M159 Polyosteoarthritis, unspecified: Secondary | ICD-10-CM

## 2019-10-02 DIAGNOSIS — M659 Synovitis and tenosynovitis, unspecified: Secondary | ICD-10-CM

## 2019-10-12 ENCOUNTER — Other Ambulatory Visit: Payer: Self-pay | Admitting: Family Medicine

## 2019-10-12 DIAGNOSIS — G4700 Insomnia, unspecified: Secondary | ICD-10-CM

## 2019-10-12 DIAGNOSIS — F430 Acute stress reaction: Secondary | ICD-10-CM

## 2019-10-12 NOTE — Telephone Encounter (Signed)
Called patient and he states he is still taking the Trazodone and isnt sure why this med was d/c from his chart.   Refilling medication with same directions as before.

## 2019-10-14 DIAGNOSIS — E785 Hyperlipidemia, unspecified: Secondary | ICD-10-CM | POA: Insufficient documentation

## 2019-10-14 DIAGNOSIS — E559 Vitamin D deficiency, unspecified: Secondary | ICD-10-CM | POA: Insufficient documentation

## 2019-10-22 ENCOUNTER — Ambulatory Visit: Payer: BC Managed Care – PPO | Admitting: Gastroenterology

## 2019-10-22 ENCOUNTER — Encounter: Payer: Self-pay | Admitting: Gastroenterology

## 2019-10-22 VITALS — BP 100/74 | HR 81 | Temp 99.1°F | Ht 66.0 in | Wt 143.0 lb

## 2019-10-22 DIAGNOSIS — K642 Third degree hemorrhoids: Secondary | ICD-10-CM | POA: Diagnosis not present

## 2019-10-22 DIAGNOSIS — K219 Gastro-esophageal reflux disease without esophagitis: Secondary | ICD-10-CM | POA: Diagnosis not present

## 2019-10-22 DIAGNOSIS — R1310 Dysphagia, unspecified: Secondary | ICD-10-CM | POA: Diagnosis not present

## 2019-10-22 MED ORDER — OMEPRAZOLE 20 MG PO CPDR: 20.0000 mg | DELAYED_RELEASE_CAPSULE | Freq: Two times a day (BID) | ORAL | 3 refills | Status: AC

## 2019-10-22 NOTE — Progress Notes (Signed)
HPI :  49 year old male here for a follow-up visit for reflux..  Please see my last intake note on March 17 for full details of his history.  He has had longstanding reflux symptoms to include pyrosis, regurgitation and water brash.  He is also had a history of nocturnal symptoms with regurgitation and cough.  He is also had a history of intermittent dysphagia.  Since his last visit we increased omeprazole to 20 mg twice daily and he states this is done a nice job at controlling his reflux symptoms better.  We performed an upper endoscopy for him on March 23.  He had a 2 cm hiatal hernia, and slightly irregular Z-line.  Biopsies showed only reflux changes, no Barrett's esophagus.  He had some subtle changes concerning for possible EOE as well.  Biopsies of his esophagus showed no evidence of eosinophilic esophagitis.  I performed an empiric dilation and he had a mucosal rent in the proximal esophagus as well as the GE J due to mild stenoses in this area.  He states dilation has resolved to dysphagia and no further symptoms that are bothering him.  He has been on PPI for a long time and asking about alternative therapies.  We had discussion about this as below.  Otherwise had a screening colonoscopy at the same time as his EGD.  No polyps were found.  He does have internal hemorrhoids, grade 3.  These prolapse from time to time but is not too common.  He denies any blood in his stools or other problems with the hemorrhoids otherwise.  We discussed if he wanted therapy for this or not.  Prior work-up: EGD 08/31/19 -  - A 2 cm hiatal hernia was present. - The Z-line was slightly irregular / slightly nodular and was found 38 cm from the incisors. Biopsies were taken with a cold forceps for histology. - Mucosal changes including mild ringed esophagus and mild longitudinal furrows were found in the entire esophagus. Slight mild stenosis noted at the GEJ, otherwise no obvious stricture noted. Biopsies  were obtained from the proximal and distal esophagus with cold forceps for histology to rule out eosinophilic esophagitis. - A guidewire was placed and the scope was withdrawn. Dilation was performed in the entire esophagus with a Savary dilator with mild resistance at 17 mm and 18 mm. Relook endoscopy showed appripriate mucosal wrents x 2 - one in the proximal eosphagus below the UES and another at the GEJ. - The entire examined stomach was normal. Biopsies were taken with a cold forceps for Helicobacter pylori testing given the patient's epigastric pain. - The duodenal bulb and second portion of the duodenum were normal.  Diagnosis 1. Surgical [P], gastric antrum and gastric body - MILD CHRONIC GASTRITIS. - WARTHIN-STARRY IS NEGATIVE FOR HELICOBACTER PYLORI. - NO INTESTINAL METAPLASIA, DYSPLASIA, OR MALIGNANCY. 2. Surgical [P], esophagus, GE junction - REFLUX CHANGES. - NO INTESTINAL METAPLASIA, DYSPLASIA, OR MALIGNANCY. 3. Surgical [P], random esophageal sites - BENIGN SQUAMOUS MUCOSA. - NO INCREASE IN INTRAEPITHELIAL EOSINOPHILS. - NO INTESTINAL METAPLASIA, DYSPLASIA, OR MALIGNANCY.   Colonoscopy 08/31/19 - The perianal and digital rectal examinations were normal. - The terminal ileum appeared normal. - Multiple medium-mouthed diverticula were found in the sigmoid colon. - Internal hemorrhoids were found during retroflexion. The hemorrhoids were moderate. - The exam was otherwise without abnormality.     Past Medical History:  Diagnosis Date  . Chronic lower back pain 01/09/2016  . GERD (gastroesophageal reflux disease) 01/12/2016  . h/o  Left ACL tear- s/p repair. 12/08/2014  . H/O motion sickness/ seasickness 01/12/2016  . Right lumbar radiculopathy 01/09/2016  . Sciatic nerve pain- R 01/09/2016     Past Surgical History:  Procedure Laterality Date  . FOOT SURGERY    . KNEE ARTHROSCOPY Left    Family History  Problem Relation Age of Onset  . Bone cancer Maternal  Grandfather   . Kidney disease Mother   . Colon cancer Neg Hx   . Stomach cancer Neg Hx   . Esophageal cancer Neg Hx   . Pancreatic cancer Neg Hx    Social History   Tobacco Use  . Smoking status: Never Smoker  . Smokeless tobacco: Never Used  Substance Use Topics  . Alcohol use: Yes    Comment: Occ beer with meals   . Drug use: No   Current Outpatient Medications  Medication Sig Dispense Refill  . meloxicam (MOBIC) 15 MG tablet TAKE 1 TABLET BY MOUTH EVERY DAY 90 tablet 0  . omeprazole (PRILOSEC) 20 MG capsule TAKE 1 CAPSULE (20 MG TOTAL) BY MOUTH 2 (TWO) TIMES DAILY BEFORE A MEAL. 180 capsule 0  . SUMAtriptan (IMITREX) 20 MG/ACT nasal spray 1 spray into nostril at onset of migraine. May repeat in 2 hours if headache persists or recurs. ( max 40mg ) 1 Inhaler 2  . traZODone (DESYREL) 50 MG tablet TAKE 1-2 TABLETS (50-100 MG TOTAL) BY MOUTH AT BEDTIME AS NEEDED FOR SLEEP. 180 tablet 0   No current facility-administered medications for this visit.   Allergies  Allergen Reactions  . Oxycodone Nausea And Vomiting     Review of Systems: All systems reviewed and negative except where noted in HPI.   Lab Results  Component Value Date   WBC 5.0 06/17/2019   HGB 16.0 06/17/2019   HCT 47.4 06/17/2019   MCV 85 06/17/2019   PLT 223 06/17/2019    Lab Results  Component Value Date   CREATININE 1.18 06/17/2019   BUN 14 06/17/2019   NA 141 06/17/2019   K 4.5 06/17/2019   CL 105 06/17/2019   CO2 23 06/17/2019    Lab Results  Component Value Date   ALT 20 06/17/2019   AST 23 06/17/2019   GGT 20 06/17/2019   ALKPHOS 81 06/17/2019   BILITOT 0.9 06/17/2019     Physical Exam: BP 100/74   Pulse 81   Temp 99.1 F (37.3 C)   Ht 5\' 6"  (1.676 m)   Wt 143 lb (64.9 kg)   BMI 23.08 kg/m  Constitutional: Pleasant,well-developed,male in no acute distress. Lymphadenopathy: No cervical adenopathy noted. Neurological: Alert and oriented to person place and time. Skin:  Skin is warm and dry. No rashes noted. Psychiatric: Normal mood and affect. Behavior is normal.   ASSESSMENT AND PLAN: 49 year old male here for reassessment the following:  GERD / dysphagia - patient with longstanding reflux symptoms on PPI, now better controlled on higher dose PPI of omeprazole 20 mg twice daily.  EGD showed 2 subtle areas of stenosis that was dilated and has had resolution of his dysphagia.  He had some subtle changes on EGD that raised the possibility of underlying EOE but biopsies are clearly negative for EOE.  We discussed long-term management options for reflux.  I discussed chronic PPI use and long-term risks and benefits of this.  While this does control his symptoms, he does not want to be on chronic medications for this if possible.  He is very interested and considering other options.  I discussed surgical Nissen fundoplication versus TIF.  I do think he is a candidate for TIF and would recommend this as a modality to treat his reflux if he wants to come off PPI.  He is quite interested in TIF and I will refer him to Dr. Bryan Lemma for consultation to get his opinion on TIF.  Patient agreed with the plan.  I will refill the omeprazole in the interim.  Hemorrhoids - I think he is a good candidate for hemorrhoid banding to treat the prolapse symptoms if he is interested in any interventional therapy for this.  We discussed risks and benefits of this.  He wants to read about it more and will let me know if he wishes to proceed at any point time.  [place] Cellar, MD [facility]

## 2019-10-22 NOTE — Patient Instructions (Addendum)
If you are age 49 or older, your body mass index should be between 23-30. Your Body mass index is 23.08 kg/m. If this is out of the aforementioned range listed, please consider follow up with your Primary Care Provider.  If you are age 59 or younger, your body mass index should be between 19-25. Your Body mass index is 23.08 kg/m. If this is out of the aformentioned range listed, please consider follow up with your Primary Care Provider.   We have sent the following medications to your pharmacy for you to pick up at your convenience: Omeprazole 20mg : Take twice a day  We are giving you a TIF handout today.  We have scheduled you for an appointment with Dr. on 12-03-19 at 2:20pm. Their address is a  [address].   Thank you for entrusting me with your care and for choosing [facility], Dr. [facility]

## 2019-10-27 ENCOUNTER — Encounter: Payer: Self-pay | Admitting: Gastroenterology

## 2019-10-27 NOTE — Progress Notes (Signed)
Spoke to patient appointment scheduled for 12/03/19 ti discus TIF procedure.

## 2019-12-03 ENCOUNTER — Ambulatory Visit: Payer: BC Managed Care – PPO | Admitting: Gastroenterology

## 2019-12-03 ENCOUNTER — Encounter: Payer: Self-pay | Admitting: Gastroenterology

## 2019-12-03 VITALS — BP 112/80 | HR 60 | Temp 98.4°F | Ht 65.5 in | Wt 146.0 lb

## 2019-12-03 DIAGNOSIS — K449 Diaphragmatic hernia without obstruction or gangrene: Secondary | ICD-10-CM | POA: Diagnosis not present

## 2019-12-03 DIAGNOSIS — K2100 Gastro-esophageal reflux disease with esophagitis, without bleeding: Secondary | ICD-10-CM

## 2019-12-03 DIAGNOSIS — K209 Esophagitis, unspecified without bleeding: Secondary | ICD-10-CM | POA: Diagnosis not present

## 2019-12-03 NOTE — Patient Instructions (Signed)
You have been scheduled for an esophageal manometry at [facility] on 02/04/20 at 1230pm. Please arrive 30 minutes prior to your procedure for registration. You will need to go to outpatient registration (1st floor of the hospital) first. Make certain to bring your insurance cards as well as a complete list of medications.  Please remember the following:  1) Do not take any muscle relaxants, xanax (alprazolam) or ativan for 1 day prior to your test as well as the day of the test.  2) Nothing to eat or drink for 4 hours before your test.  3) Hold all diabetic medications/insulin the morning of the test. You may eat and take your medications after the test.  It will take at least 2 weeks to receive the results of this test from your physician. ------------------------------------------ ABOUT ESOPHAGEAL MANOMETRY Esophageal manometry (muh-NOM-uh-tree) is a test that gauges how well your esophagus works. Your esophagus is the long, muscular tube that connects your throat to your stomach. Esophageal manometry measures the rhythmic muscle contractions (peristalsis) that occur in your esophagus when you swallow. Esophageal manometry also measures the coordination and force exerted by the muscles of your esophagus.  During esophageal manometry, a thin, flexible tube (catheter) that contains sensors is passed through your nose, down your esophagus and into your stomach. Esophageal manometry can be helpful in diagnosing some mostly uncommon disorders that affect your esophagus.  Why it's done Esophageal manometry is used to evaluate the movement (motility) of food through the esophagus and into the stomach. The test measures how well the circular bands of muscle (sphincters) at the top and bottom of your esophagus open and close, as well as the pressure, strength and pattern of the wave of esophageal muscle contractions that moves food along.  What you can expect Esophageal manometry is an  outpatient procedure done without sedation. Most people tolerate it well. You may be asked to change into a hospital gown before the test starts.  During esophageal manometry  . While you are sitting up, a member of your health care team sprays your throat with a numbing medication or puts numbing gel in your nose or both.  . A catheter is guided through your nose into your esophagus. The catheter may be sheathed in a water-filled sleeve. It doesn't interfere with your breathing. However, your eyes may water, and you may gag. You may have a slight nosebleed from irritation.  . After the catheter is in place, you may be asked to lie on your back on an exam table, or you may be asked to remain seated.  . You then swallow small sips of water. As you do, a computer connected to the catheter records the pressure, strength and pattern of your esophageal muscle contractions.  . During the test, you'll be asked to breathe slowly and smoothly, remain as still as possible, and swallow only when you're asked to do so.  . A member of your health care team may move the catheter down into your stomach while the catheter continues its measurements.  . The catheter then is slowly withdrawn. The test usually lasts 20 to 30 minutes.  After esophageal manometry  When your esophageal manometry is complete, you may return to your normal activities  This test typically takes 30-45 minutes to complete.  We have sent a referral to [facility] at [address] [place], [place] [address]. [phone]. You will be receiving a call to schedule an appointment with  them soon. Dr Redmond Pulling  It was a pleasure to see you today!  Gerrit Heck, D.O.  ________________________________________________________________________________

## 2019-12-03 NOTE — Progress Notes (Signed)
P  Chief Complaint:    GERD, hiatal hernia, evaluation for TIF  Referring physician: Dr. Adela Lank  HPI:    Steve [name] is a 49 yo male referred to me by Dr. Adela Lank for evaluation of possible antireflux intervention with Transoral Incisionless Fundoplication (TIF) with a goal to stop or significantly reduce acid suppression therapy.  Longstanding history of reflux for a few years with index symptoms as outlined below.  Increased omeprazole to 40 mg bid earlier this year with good clinical response, and has since decreased to 20 mg BID with ongoing efficacy.   GERD history: -Index symptoms: Pyrosis, regurgitation, waterbrash.  Nocturnal regurgitation and cough.  Intermittent dysphagia-resolved with esophageal dilation 08/2019. -Medications trialed: Omeprazole -Current medications: Omeprazole 20 mg bid -Complications: Erosive esophagitis, hiatal hernia  GERD evaluation: -Last EGD: 08/31/2019.  Reviewed images myself today.  Appears to have LA Grade A esophagitis with reflux changes noted on biopsy.  2 cm axial height HH.  Retroflexed views with likely Hill Grade 3 valve (only 1 image and retroflexion for review) -Barium esophagram: None -Esophageal Manometry: Ordered today -pH/Impedance: N/A  Endoscopic History: -EGD (08/31/2019, Dr. Adela Lank): 2 cm HH, slightly irregular Z-line (biopsies: Reflux changes without Barrett's).  Esophageal biopsies otherwise negative for EOE.  Empiric dilation with 17 mm 18 mm Savary with mucosal rent x2-1 at the [facility] and 1 at [facility] -Colonoscopy (08/31/2019, Dr. Adela Lank): Internal hemorrhoids, sigmoid diverticulosis, otherwise normal    Review of systems:     No chest pain, no SOB, no fevers, no urinary sx   Past Medical History:  Diagnosis Date  . Chronic lower back pain 01/09/2016  . GERD (gastroesophageal reflux disease) 01/12/2016  . h/o Left ACL tear- s/p repair. 12/08/2014  . H/O motion sickness/ seasickness 01/12/2016  . Right lumbar  radiculopathy 01/09/2016  . Sciatic nerve pain- R 01/09/2016    Patient's surgical history, family medical history, social history, medications and allergies were all reviewed in Epic    Current Outpatient Medications  Medication Sig Dispense Refill  . omeprazole (PRILOSEC) 20 MG capsule Take 1 capsule (20 mg total) by mouth 2 (two) times daily before a meal. 180 capsule 3  . SUMAtriptan (IMITREX) 20 MG/ACT nasal spray 1 spray into nostril at onset of migraine. May repeat in 2 hours if headache persists or recurs. ( max 40mg ) 1 Inhaler 2  . traZODone (DESYREL) 50 MG tablet TAKE 1-2 TABLETS (50-100 MG TOTAL) BY MOUTH AT BEDTIME AS NEEDED FOR SLEEP. 180 tablet 0   No current facility-administered medications for this visit.    Physical Exam:     BP 112/80   Pulse 60   Temp 98.4 F (36.9 C)   Ht 5' 5.5" (1.664 m)   Wt 146 lb (66.2 kg)   BMI 23.93 kg/m   GENERAL:  Pleasant male in NAD PSYCH: : Cooperative, normal affect ABDOMEN:  Nondistended, soft, nontender. No obvious masses, no hepatomegaly,  normal bowel sounds SKIN:  turgor, no lesions seen Musculoskeletal:  Normal muscle tone, normal strength NEURO: Alert and oriented x 3, no focal neurologic deficits   IMPRESSION and PLAN:    1) GERD with erosive esophagitis 2) Hiatal hernia  49 year old male with longstanding history of GERD complicated by erosive esophagitis and hiatal hernia.  2 cm axial hiatal hernia measured on recent EGD, but images reviewed today, and appears to have Hill Grade 3 valve.  I am highly suspicious for >2 cm transverse width valve.  Discussed options today to include 1)  repeat EGD for evaluation of valve vs 2) referral to [facility] for cTIF.  After good discussion with the patient he strongly prefers the latter.  Again, reviewing those images, endoscopic views consistent with Hill grade 3 valve that would be more appropriate for cTIF, and agree with his plan  -Referral to Dr. Redmond Pulling at [place] for evaluation  of cTIF -Order esophageal manometry today in anticipation of laparoscopic hiatal hernia repair/TIF -Good objective evidence of reflux on recent EGD, so no further pH/impedance testing needed -Continue omeprazole as currently prescribed -Continue antireflux lifestyle/dietary modifications -We will discuss postoperative dietary limitations further at follow-up  I spent 35 minutes of time, including in depth chart review, independent review of results as outlined above, communicating results with the patient directly, face-to-face time with the patient, coordinating care, and ordering studies and medications as appropriate, and documentation.         Lavena Bullion ,DO, FACG 12/03/2019, 3:05 PM

## 2020-01-14 ENCOUNTER — Other Ambulatory Visit: Payer: Self-pay | Admitting: Physician Assistant

## 2020-01-14 DIAGNOSIS — F430 Acute stress reaction: Secondary | ICD-10-CM

## 2020-01-14 DIAGNOSIS — G4700 Insomnia, unspecified: Secondary | ICD-10-CM

## 2020-01-25 ENCOUNTER — Ambulatory Visit: Payer: BC Managed Care – PPO | Admitting: Physician Assistant

## 2020-02-02 ENCOUNTER — Other Ambulatory Visit (HOSPITAL_COMMUNITY): Payer: BC Managed Care – PPO

## 2020-02-03 DIAGNOSIS — Z20828 Contact with and (suspected) exposure to other viral communicable diseases: Secondary | ICD-10-CM | POA: Diagnosis not present

## 2020-02-03 DIAGNOSIS — J3489 Other specified disorders of nose and nasal sinuses: Secondary | ICD-10-CM | POA: Diagnosis not present

## 2020-02-04 ENCOUNTER — Ambulatory Visit (HOSPITAL_COMMUNITY)
Admission: RE | Admit: 2020-02-04 | Payer: BC Managed Care – PPO | Source: Home / Self Care | Admitting: Gastroenterology

## 2020-02-04 ENCOUNTER — Encounter (HOSPITAL_COMMUNITY): Admission: RE | Payer: Self-pay | Source: Home / Self Care

## 2020-02-04 SURGERY — MANOMETRY, ESOPHAGUS

## 2020-02-27 ENCOUNTER — Other Ambulatory Visit: Payer: Self-pay | Admitting: Physician Assistant

## 2020-02-27 DIAGNOSIS — F430 Acute stress reaction: Secondary | ICD-10-CM

## 2020-02-27 DIAGNOSIS — G4700 Insomnia, unspecified: Secondary | ICD-10-CM

## 2020-03-14 ENCOUNTER — Other Ambulatory Visit: Payer: Self-pay | Admitting: Physician Assistant

## 2020-03-14 DIAGNOSIS — G4700 Insomnia, unspecified: Secondary | ICD-10-CM

## 2020-03-14 DIAGNOSIS — F430 Acute stress reaction: Secondary | ICD-10-CM

## 2020-04-13 ENCOUNTER — Telehealth: Payer: Self-pay | Admitting: Physician Assistant

## 2020-04-13 ENCOUNTER — Other Ambulatory Visit: Payer: Self-pay | Admitting: Physician Assistant

## 2020-04-13 DIAGNOSIS — G4700 Insomnia, unspecified: Secondary | ICD-10-CM

## 2020-04-13 DIAGNOSIS — F430 Acute stress reaction: Secondary | ICD-10-CM

## 2020-04-13 NOTE — Telephone Encounter (Signed)
Please call patient to schedule OV for further med refills. AS, CMA 

## 2020-05-02 ENCOUNTER — Other Ambulatory Visit: Payer: Self-pay | Admitting: Physician Assistant

## 2020-05-02 DIAGNOSIS — F430 Acute stress reaction: Secondary | ICD-10-CM

## 2020-05-02 DIAGNOSIS — G4700 Insomnia, unspecified: Secondary | ICD-10-CM

## 2020-05-25 ENCOUNTER — Other Ambulatory Visit: Payer: Self-pay | Admitting: Physician Assistant

## 2020-05-25 DIAGNOSIS — F430 Acute stress reaction: Secondary | ICD-10-CM

## 2020-05-25 DIAGNOSIS — G4700 Insomnia, unspecified: Secondary | ICD-10-CM

## 2020-05-25 NOTE — Telephone Encounter (Signed)
Please call patient and schedule OV for med refills. AS, CMA

## 2020-05-29 NOTE — Telephone Encounter (Signed)
Left vm on 05-30-20

## 2020-06-02 ENCOUNTER — Other Ambulatory Visit: Payer: Self-pay | Admitting: Physician Assistant

## 2020-06-02 DIAGNOSIS — F430 Acute stress reaction: Secondary | ICD-10-CM

## 2020-06-02 DIAGNOSIS — G4700 Insomnia, unspecified: Secondary | ICD-10-CM

## 2020-06-06 ENCOUNTER — Telehealth: Payer: Self-pay | Admitting: Physician Assistant

## 2020-06-06 NOTE — Telephone Encounter (Signed)
Please contact patient to schedule OV for further med refills. AS, CMA

## 2021-07-08 IMAGING — NM NM HEPATO W/GB/PHARM/[PERSON_NAME]
2 series · 12 of 12 positions shown · non-contrast
Comparison: None.

CLINICAL DATA: Upper abdominal pain

EXAM:
NUCLEAR MEDICINE HEPATOBILIARY IMAGING WITH GALLBLADDER EF
VIEWS:
Anterior right upper quadrant
RADIOPHARMACEUTICALS:  5.1 mCi 9c-XXm mebrofenin IV

[Series 1: biliary · 3.25mm/px · 6 of 60 frames shown]
[frame 6/60]
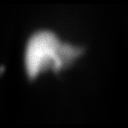
[frame 16/60]
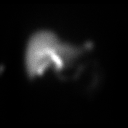
[frame 26/60]
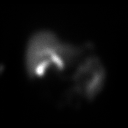
[frame 36/60]
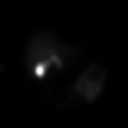
[frame 46/60]
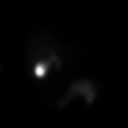
[frame 56/60]
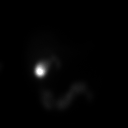

[Series 2: gbef · 3.25mm/px · 6 of 60 frames shown]
[frame 6/60]
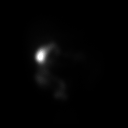
[frame 16/60]
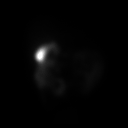
[frame 26/60]
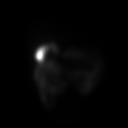
[frame 36/60]
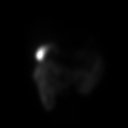
[frame 46/60]
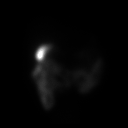
[frame 56/60]
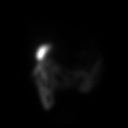

[12 of 12 positions shown; findings below may reference images not displayed]

FINDINGS: Liver uptake of radiotracer is unremarkable. There is prompt
visualization of gallbladder and small bowel, indicating patency of
the cystic and common bile ducts. Patient consumed 8 ounces of
Ensure orally with calculation of the computer generated ejection
fraction of radiotracer from the gallbladder. The patient did not
report clinical symptoms with the oral Ensure consumption. The
computer generated ejection fraction of radiotracer from the
gallbladder is within normal limits at 39%, normal greater than 33%
using the oral agent.
IMPRESSION: Study within normal limits.

## 2022-07-03 ENCOUNTER — Ambulatory Visit: Payer: Self-pay | Admitting: General Surgery

## 2022-07-03 NOTE — H&P (View-Only) (Signed)
Chief Complaint: New Consultation (Hiatal Hernia with Reflux)       History of Present Illness: Steve [name] is a 52 y.o. male who is seen today as an office consultation at the request of Dr. Helene Kelp for evaluation of New Consultation (Hiatal Hernia with Reflux) .   Patient is a 52 year old male who comes in secondary to reflux.  He was previously diagnosed with a small hiatal hernia in 2021 by Dr. Havery Moros.  He states that since that time he had some issues with his daughters surgery.  He put this off.  He states that his reflux is gotten worse.  He is having difficulty with laying down and has aspiration at nighttime.  Patient's endoscopy in 2021 did show a small 2 cm hiatal hernia.   Patient currently on omeprazole and having difficulty with still with reflux even with that.   Patient has had no previous abdominal surgery.   I did review the patient's outpatient notes as well as E GD.       Review of Systems: A complete review of systems was obtained from the patient.  I have reviewed this information and discussed as appropriate with the patient.  See HPI as well for other ROS.   Review of Systems  Constitutional:  Negative for fever.  HENT:  Negative for congestion.   Eyes:  Negative for blurred vision.  Respiratory:  Negative for cough, shortness of breath and wheezing.   Cardiovascular:  Negative for chest pain and palpitations.  Gastrointestinal:  Negative for heartburn.  Genitourinary:  Negative for dysuria.  Musculoskeletal:  Negative for myalgias.  Skin:  Negative for rash.  Neurological:  Negative for dizziness and headaches.  Psychiatric/Behavioral:  Negative for depression and suicidal ideas.   All other systems reviewed and are negative.       Medical History: Past Medical History Past Medical History: Diagnosis Date  Arthritis    GERD (gastroesophageal reflux disease)        There is no problem list on file for this patient.     Past Surgical  History Past Surgical History: Procedure Laterality Date  Cyst Removal Surgery    1997  Knee Surgery    2015      Allergies Allergies Allergen Reactions  Oxycodone Nausea And Vomiting      Current Outpatient Medications on File Prior to Visit Medication Sig Dispense Refill  omeprazole (PRILOSEC) 20 MG DR capsule Take 20 mg by mouth       No current facility-administered medications on file prior to visit.     Family History History reviewed. No pertinent family history.     Social History   Tobacco Use Smoking Status Never Smokeless Tobacco Never     Social History Social History    Socioeconomic History  Marital status: Married Tobacco Use  Smoking status: Never  Smokeless tobacco: Never Substance and Sexual Activity  Alcohol use: Not Currently  Drug use: Never      Objective:     Vitals:   07/03/22 1509 BP: (!) 145/101 Pulse: 60 Temp: 37 C (98.6 F) SpO2: 99% Weight: 71.7 kg (158 lb) Height: 167.6 cm (5' 6"$ ) PainSc:   6   Body mass index is 25.5 kg/m.   Physical Exam Constitutional:      General: He is not in acute distress.    Appearance: Normal appearance.  HENT:     Head: Normocephalic.     Nose: No rhinorrhea.     Mouth/Throat:  Mouth: Mucous membranes are moist.     Pharynx: Oropharynx is clear.  Eyes:     General: No scleral icterus.    Pupils: Pupils are equal, round, and reactive to light.  Cardiovascular:     Rate and Rhythm: Normal rate.     Pulses: Normal pulses.  Pulmonary:     Effort: Pulmonary effort is normal. No respiratory distress.     Breath sounds: No stridor. No wheezing.  Abdominal:     General: Abdomen is flat. There is no distension.     Tenderness: There is no abdominal tenderness. There is no guarding or rebound.  Musculoskeletal:        General: Normal range of motion.     Cervical back: Normal range of motion and neck supple.  Skin:    General: Skin is warm and dry.     Capillary Refill:  Capillary refill takes less than 2 seconds.     Coloration: Skin is not jaundiced.  Neurological:     General: No focal deficit present.     Mental Status: He is alert and oriented to person, place, and time. Mental status is at baseline.  Psychiatric:        Mood and Affect: Mood normal.        Thought Content: Thought content normal.        Judgment: Judgment normal.            Assessment and Plan: Diagnoses and all orders for this visit:   Hiatal hernia     Steve [name] is a 52 y.o. male    1.  We will proceed to the OR for a robotic hiatal hernia repair with mesh and fundoplication 2. All risks and benefits were discussed with the patient, to generally include infection, bleeding, damage to surrounding structures, possible pneumothorax, and recurrence. Alternatives were offered and described.  All questions were answered and the patient voiced understanding of the procedure and wishes to proceed at this point.

## 2022-07-03 NOTE — H&P (Signed)
Chief Complaint: New Consultation (Hiatal Hernia with Reflux)       History of Present Illness: Steve [name] is a 52 y.o. male who is seen today as an office consultation at the request of Dr. Helene Kelp for evaluation of New Consultation (Hiatal Hernia with Reflux) .   Patient is a 52 year old male who comes in secondary to reflux.  He was previously diagnosed with a small hiatal hernia in 2021 by Dr. Havery Moros.  He states that since that time he had some issues with his daughters surgery.  He put this off.  He states that his reflux is gotten worse.  He is having difficulty with laying down and has aspiration at nighttime.  Patient's endoscopy in 2021 did show a small 2 cm hiatal hernia.   Patient currently on omeprazole and having difficulty with still with reflux even with that.   Patient has had no previous abdominal surgery.   I did review the patient's outpatient notes as well as E GD.       Review of Systems: A complete review of systems was obtained from the patient.  I have reviewed this information and discussed as appropriate with the patient.  See HPI as well for other ROS.   Review of Systems  Constitutional:  Negative for fever.  HENT:  Negative for congestion.   Eyes:  Negative for blurred vision.  Respiratory:  Negative for cough, shortness of breath and wheezing.   Cardiovascular:  Negative for chest pain and palpitations.  Gastrointestinal:  Negative for heartburn.  Genitourinary:  Negative for dysuria.  Musculoskeletal:  Negative for myalgias.  Skin:  Negative for rash.  Neurological:  Negative for dizziness and headaches.  Psychiatric/Behavioral:  Negative for depression and suicidal ideas.   All other systems reviewed and are negative.       Medical History: Past Medical History Past Medical History: Diagnosis Date  Arthritis    GERD (gastroesophageal reflux disease)        There is no problem list on file for this patient.     Past Surgical  History Past Surgical History: Procedure Laterality Date  Cyst Removal Surgery    1997  Knee Surgery    2015      Allergies Allergies Allergen Reactions  Oxycodone Nausea And Vomiting      Current Outpatient Medications on File Prior to Visit Medication Sig Dispense Refill  omeprazole (PRILOSEC) 20 MG DR capsule Take 20 mg by mouth       No current facility-administered medications on file prior to visit.     Family History History reviewed. No pertinent family history.     Social History   Tobacco Use Smoking Status Never Smokeless Tobacco Never     Social History Social History    Socioeconomic History  Marital status: Married Tobacco Use  Smoking status: Never  Smokeless tobacco: Never Substance and Sexual Activity  Alcohol use: Not Currently  Drug use: Never      Objective:     Vitals:   07/03/22 1509 BP: (!) 145/101 Pulse: 60 Temp: 37 C (98.6 F) SpO2: 99% Weight: 71.7 kg (158 lb) Height: 167.6 cm (5' 6"$ ) PainSc:   6   Body mass index is 25.5 kg/m.   Physical Exam Constitutional:      General: He is not in acute distress.    Appearance: Normal appearance.  HENT:     Head: Normocephalic.     Nose: No rhinorrhea.     Mouth/Throat:  Mouth: Mucous membranes are moist.     Pharynx: Oropharynx is clear.  Eyes:     General: No scleral icterus.    Pupils: Pupils are equal, round, and reactive to light.  Cardiovascular:     Rate and Rhythm: Normal rate.     Pulses: Normal pulses.  Pulmonary:     Effort: Pulmonary effort is normal. No respiratory distress.     Breath sounds: No stridor. No wheezing.  Abdominal:     General: Abdomen is flat. There is no distension.     Tenderness: There is no abdominal tenderness. There is no guarding or rebound.  Musculoskeletal:        General: Normal range of motion.     Cervical back: Normal range of motion and neck supple.  Skin:    General: Skin is warm and dry.     Capillary Refill:  Capillary refill takes less than 2 seconds.     Coloration: Skin is not jaundiced.  Neurological:     General: No focal deficit present.     Mental Status: He is alert and oriented to person, place, and time. Mental status is at baseline.  Psychiatric:        Mood and Affect: Mood normal.        Thought Content: Thought content normal.        Judgment: Judgment normal.            Assessment and Plan: Diagnoses and all orders for this visit:   Hiatal hernia     Steve [name] is a 52 y.o. male    1.  We will proceed to the OR for a robotic hiatal hernia repair with mesh and fundoplication 2. All risks and benefits were discussed with the patient, to generally include infection, bleeding, damage to surrounding structures, possible pneumothorax, and recurrence. Alternatives were offered and described.  All questions were answered and the patient voiced understanding of the procedure and wishes to proceed at this point.

## 2022-07-04 ENCOUNTER — Other Ambulatory Visit (HOSPITAL_COMMUNITY): Payer: Self-pay | Admitting: General Surgery

## 2022-07-04 DIAGNOSIS — K449 Diaphragmatic hernia without obstruction or gangrene: Secondary | ICD-10-CM

## 2022-07-16 ENCOUNTER — Ambulatory Visit (HOSPITAL_COMMUNITY)
Admission: RE | Admit: 2022-07-16 | Discharge: 2022-07-16 | Disposition: A | Discharge status: Home / Self Care | Payer: BC Managed Care – PPO | Source: Ambulatory Visit | Attending: General Surgery | Admitting: General Surgery

## 2022-07-16 DIAGNOSIS — K449 Diaphragmatic hernia without obstruction or gangrene: Secondary | ICD-10-CM | POA: Diagnosis present

## 2022-07-24 NOTE — Pre-Procedure Instructions (Signed)
Surgical Instructions    Your procedure is scheduled on Tuesday February 20th.  Report to [facility] Main Entrance "A" at 07:15 A.M., then check in with the Admitting office.  Call this number if you have problems the morning of surgery:  [phone]  If you have any questions prior to your surgery date call [phone]: Open Monday-Friday 8am-4pm If you experience any cold or flu symptoms such as cough, fever, chills, shortness of breath, etc. between now and your scheduled surgery, please notify us at the above number.     Remember:  Do not eat after midnight the night before your surgery  You may drink clear liquids until 06:15 AM the morning of your surgery.   Clear liquids allowed are: Water, Non-Citrus Juices (without pulp), Carbonated Beverages, Clear Tea, Black Coffee Only (NO MILK, CREAM OR POWDERED CREAMER of any kind), and Gatorade.   Patient Instructions  The night before surgery:  No food after midnight. ONLY clear liquids after midnight  The day of surgery (if you do NOT have diabetes):  Drink ONE (1) Pre-Surgery Clear Ensure by 06:15 AM the morning of surgery. Drink in one sitting. Do not sip.  This drink was given to you during your hospital  pre-op appointment visit.  Nothing else to drink after completing the  Pre-Surgery Clear Ensure.         If you have questions, please contact your surgeon's office.     Take these medicines the morning of surgery with A SIP OF WATER  omeprazole (PRILOSEC)  SUMAtriptan (IMITREX)- if needed   As of today, STOP taking any Aspirin (unless otherwise instructed by your surgeon) Aleve, Naproxen, Ibuprofen, Motrin, Advil, Goody's, BC's, all herbal medications, fish oil, and all vitamins.                     Do NOT Smoke (Tobacco/Vaping) for 24 hours prior to your procedure.  If you use a CPAP at night, you may bring your mask/headgear for your overnight stay.   Contacts, glasses, piercing's, hearing aid's, dentures or  partials may not be worn into surgery, please bring cases for these belongings.    For patients admitted to the hospital, discharge time will be determined by your treatment team.   Patients discharged the day of surgery will not be allowed to drive home, and someone needs to stay with them for 24 hours.  SURGICAL WAITING ROOM VISITATION Patients having surgery or a procedure may have no more than 2 support people in the waiting area - these visitors may rotate.   Children under the age of 68 must have an adult with them who is not the patient. If the patient needs to stay at the hospital during part of their recovery, the visitor guidelines for inpatient rooms apply. Pre-op nurse will coordinate an appropriate time for 1 support person to accompany patient in pre-op.  This support person may not rotate.   Please refer to the [facility] website for the visitor guidelines for Inpatients (after your surgery is over and you are in a regular room).    Special instructions:   [place]- Preparing For Surgery  Before surgery, you can play an important role. Because skin is not sterile, your skin needs to be as free of germs as possible. You can reduce the number of germs on your skin by washing with CHG (chlorahexidine gluconate) Soap before surgery.  CHG is an antiseptic cleaner which kills germs and bonds with the skin to  continue killing germs even after washing.    Oral Hygiene is also important to reduce your risk of infection.  Remember - BRUSH YOUR TEETH THE MORNING OF SURGERY WITH YOUR REGULAR TOOTHPASTE  Please do not use if you have an allergy to CHG or antibacterial soaps. If your skin becomes reddened/irritated stop using the CHG.  Do not shave (including legs and underarms) for at least 48 hours prior to first CHG shower. It is OK to shave your face.  Please follow these instructions carefully.   Shower the NIGHT BEFORE SURGERY and the MORNING OF SURGERY  If you chose to wash  your hair, wash your hair first as usual with your normal shampoo.  After you shampoo, rinse your hair and body thoroughly to remove the shampoo.  Use CHG Soap as you would any other liquid soap. You can apply CHG directly to the skin and wash gently with a scrungie or a clean washcloth.   Apply the CHG Soap to your body ONLY FROM THE NECK DOWN.  Do not use on open wounds or open sores. Avoid contact with your eyes, ears, mouth and genitals (private parts). Wash Face and genitals (private parts)  with your normal soap.   Wash thoroughly, paying special attention to the area where your surgery will be performed.  Thoroughly rinse your body with warm water from the neck down.  DO NOT shower/wash with your normal soap after using and rinsing off the CHG Soap.  Pat yourself dry with a CLEAN TOWEL.  Wear CLEAN PAJAMAS to bed the night before surgery  Place CLEAN SHEETS on your bed the night before your surgery  DO NOT SLEEP WITH PETS.   Day of Surgery: Take a shower with CHG soap. Do not wear jewelry  Do not wear lotions, powders, colognes, or deodorant. Men may shave face and neck. Do not bring valuables to the hospital. [facility] is not responsible for any belongings or valuables.  Wear Clean/Comfortable clothing the morning of surgery Remember to brush your teeth WITH YOUR REGULAR TOOTHPASTE.   Please read over the following fact sheets that you were given.    If you received a COVID test during your pre-op visit  it is requested that you wear a mask when out in public, stay away from anyone that may not be feeling well and notify your surgeon if you develop symptoms. If you have been in contact with anyone that has tested positive in the last 10 days please notify you surgeon.

## 2022-07-25 ENCOUNTER — Encounter (HOSPITAL_COMMUNITY)
Admission: RE | Admit: 2022-07-25 | Discharge: 2022-07-25 | Disposition: A | Discharge status: Home / Self Care | Payer: BC Managed Care – PPO | Source: Ambulatory Visit | Attending: General Surgery | Admitting: General Surgery

## 2022-07-25 ENCOUNTER — Other Ambulatory Visit: Payer: Self-pay

## 2022-07-25 ENCOUNTER — Encounter (HOSPITAL_COMMUNITY): Payer: Self-pay

## 2022-07-25 VITALS — BP 137/91 | HR 63 | Temp 97.5°F | Resp 17 | Ht 65.0 in | Wt 160.0 lb

## 2022-07-25 DIAGNOSIS — Z01812 Encounter for preprocedural laboratory examination: Secondary | ICD-10-CM | POA: Insufficient documentation

## 2022-07-25 DIAGNOSIS — K449 Diaphragmatic hernia without obstruction or gangrene: Secondary | ICD-10-CM | POA: Insufficient documentation

## 2022-07-25 DIAGNOSIS — Z01818 Encounter for other preprocedural examination: Secondary | ICD-10-CM

## 2022-07-25 HISTORY — DX: Unspecified osteoarthritis, unspecified site: M19.90

## 2022-07-25 HISTORY — DX: Headache, unspecified: R51.9

## 2022-07-25 HISTORY — DX: Personal history of other diseases of the digestive system: Z87.19

## 2022-07-25 HISTORY — DX: Personal history of urinary calculi: Z87.442

## 2022-07-25 LAB — CBC
HCT: 46.1 % (ref 39.0–52.0)
Hemoglobin: 15.7 g/dL (ref 13.0–17.0)
MCH: 28.7 pg (ref 26.0–34.0)
MCHC: 34.1 g/dL (ref 30.0–36.0)
MCV: 84.3 fL (ref 80.0–100.0)
Platelets: 290 10*3/uL (ref 150–400)
RBC: 5.47 MIL/uL (ref 4.22–5.81)
RDW: 13 % (ref 11.5–15.5)
WBC: 6.5 10*3/uL (ref 4.0–10.5)
nRBC: 0 % (ref 0.0–0.2)

## 2022-07-25 LAB — BASIC METABOLIC PANEL
Anion gap: 7 (ref 5–15)
BUN: 11 mg/dL (ref 6–20)
CO2: 27 mmol/L (ref 22–32)
Calcium: 9.5 mg/dL (ref 8.9–10.3)
Chloride: 102 mmol/L (ref 98–111)
Creatinine, Ser: 1.28 mg/dL — ABNORMAL HIGH (ref 0.61–1.24)
GFR, Estimated: >60 mL/min (ref >60)
Glucose, Bld: 77 mg/dL (ref 70–99)
Potassium: 3.8 mmol/L (ref 3.5–5.1)
Sodium: 136 mmol/L (ref 135–145)

## 2022-07-25 NOTE — Progress Notes (Signed)
PCP - Dr. Kennith Maes Cardiologist - denies  PPM/ICD - denies   Chest x-ray - denies EKG - 20 years ago per pt, normal per pt Stress Test - denies ECHO - denies Cardiac Cath - denies   DM- denies  ASA/Blood Thinner Instructions: n/a   ERAS Protcol - yes PRE-SURGERY Ensure given at PAT  COVID TEST- n/a   Anesthesia review: no  Patient denies shortness of breath, fever, cough and chest pain at PAT appointment   All instructions explained to the patient, with a verbal understanding of the material. Patient agrees to go over the instructions while at home for a better understanding. The opportunity to ask questions was provided.

## 2022-07-30 ENCOUNTER — Ambulatory Visit (HOSPITAL_COMMUNITY): Payer: BC Managed Care – PPO | Admitting: Vascular Surgery

## 2022-07-30 ENCOUNTER — Encounter (HOSPITAL_COMMUNITY): Payer: Self-pay | Admitting: General Surgery

## 2022-07-30 ENCOUNTER — Other Ambulatory Visit: Payer: Self-pay

## 2022-07-30 ENCOUNTER — Encounter (HOSPITAL_COMMUNITY)
Admission: RE | Disposition: A | Discharge status: Home / Self Care | Payer: Self-pay | Source: Ambulatory Visit | Attending: General Surgery

## 2022-07-30 ENCOUNTER — Observation Stay (HOSPITAL_COMMUNITY)
Admission: RE | Admit: 2022-07-30 | Discharge: 2022-07-31 | Disposition: A | Discharge status: Home / Self Care | Payer: BC Managed Care – PPO | Source: Ambulatory Visit | Attending: General Surgery | Admitting: General Surgery

## 2022-07-30 ENCOUNTER — Ambulatory Visit (HOSPITAL_COMMUNITY): Payer: BC Managed Care – PPO | Admitting: Anesthesiology

## 2022-07-30 DIAGNOSIS — K449 Diaphragmatic hernia without obstruction or gangrene: Principal | ICD-10-CM | POA: Insufficient documentation

## 2022-07-30 DIAGNOSIS — K219 Gastro-esophageal reflux disease without esophagitis: Secondary | ICD-10-CM | POA: Insufficient documentation

## 2022-07-30 DIAGNOSIS — Z9889 Other specified postprocedural states: Secondary | ICD-10-CM | POA: Diagnosis present

## 2022-07-30 HISTORY — PX: XI ROBOTIC ASSISTED HIATAL HERNIA REPAIR: SHX6889

## 2022-07-30 SURGERY — REPAIR, HERNIA, HIATAL, ROBOT-ASSISTED
Anesthesia: General | Site: Abdomen

## 2022-07-30 MED ORDER — BUPIVACAINE-EPINEPHRINE 0.25% -1:200000 IJ SOLN
INTRAMUSCULAR | Status: DC | PRN
  Administered 2022-07-30: 20 mL

## 2022-07-30 MED ORDER — PHENYLEPHRINE 80 MCG/ML (10ML) SYRINGE FOR IV PUSH (FOR BLOOD PRESSURE SUPPORT): PREFILLED_SYRINGE | INTRAVENOUS | Status: DC | PRN

## 2022-07-30 MED ORDER — ROCURONIUM BROMIDE 10 MG/ML (PF) SYRINGE
PREFILLED_SYRINGE | INTRAVENOUS | Status: DC | PRN
  Administered 2022-07-30 (×2): 50 mg via INTRAVENOUS

## 2022-07-30 MED ORDER — PHENYLEPHRINE HCL-NACL 20-0.9 MG/250ML-% IV SOLN
INTRAVENOUS | Status: DC | PRN
  Administered 2022-07-30: 50 ug/min via INTRAVENOUS

## 2022-07-30 MED ORDER — CEFAZOLIN SODIUM-DEXTROSE 2-4 GM/100ML-% IV SOLN
2.0000 g | INTRAVENOUS | Status: AC
  Administered 2022-07-30: 2 g via INTRAVENOUS
  Filled 2022-07-30: qty 100

## 2022-07-30 MED ORDER — ACETAMINOPHEN 500 MG PO TABS
ORAL_TABLET | ORAL | Status: AC
  Filled 2022-07-30: qty 2

## 2022-07-30 MED ORDER — DEXMEDETOMIDINE HCL IN NACL 80 MCG/20ML IV SOLN
INTRAVENOUS | Status: DC | PRN
  Administered 2022-07-30: 12 ug via BUCCAL

## 2022-07-30 MED ORDER — ACETAMINOPHEN 500 MG PO TABS: 1000.0000 mg | ORAL_TABLET | ORAL | Status: DC

## 2022-07-30 MED ORDER — BUPIVACAINE LIPOSOME 1.3 % IJ SUSP
INTRAMUSCULAR | Status: AC
  Filled 2022-07-30: qty 20

## 2022-07-30 MED ORDER — EPHEDRINE SULFATE-NACL 50-0.9 MG/10ML-% IV SOSY
PREFILLED_SYRINGE | INTRAVENOUS | Status: DC | PRN
  Administered 2022-07-30: 5 mg via INTRAVENOUS

## 2022-07-30 MED ORDER — ACETAMINOPHEN 500 MG PO TABS
ORAL_TABLET | ORAL | Status: AC
  Administered 2022-07-30: 1000 mg via ORAL
  Filled 2022-07-30: qty 1

## 2022-07-30 MED ORDER — ONDANSETRON HCL 4 MG/2ML IJ SOLN: 4.0000 mg | Freq: Once | INTRAMUSCULAR | Status: DC | PRN

## 2022-07-30 MED ORDER — MIDAZOLAM HCL 2 MG/2ML IJ SOLN
INTRAMUSCULAR | Status: AC
  Filled 2022-07-30: qty 2

## 2022-07-30 MED ORDER — HYDROMORPHONE HCL 1 MG/ML IJ SOLN
0.2500 mg | INTRAMUSCULAR | Status: DC | PRN
  Administered 2022-07-30: 0.5 mg via INTRAVENOUS

## 2022-07-30 MED ORDER — LACTATED RINGERS IV SOLN: INTRAVENOUS | Status: DC

## 2022-07-30 MED ORDER — FENTANYL CITRATE (PF) 250 MCG/5ML IJ SOLN
INTRAMUSCULAR | Status: DC | PRN
  Administered 2022-07-30: 100 ug via INTRAVENOUS

## 2022-07-30 MED ORDER — ONDANSETRON HCL 4 MG/2ML IJ SOLN
INTRAMUSCULAR | Status: DC | PRN
  Administered 2022-07-30: 4 mg via INTRAVENOUS

## 2022-07-30 MED ORDER — SCOPOLAMINE 1 MG/3DAYS TD PT72
1.0000 | MEDICATED_PATCH | TRANSDERMAL | Status: DC
  Administered 2022-07-30: 1.5 mg via TRANSDERMAL

## 2022-07-30 MED ORDER — CHLORHEXIDINE GLUCONATE CLOTH 2 % EX PADS: 6.0000 | MEDICATED_PAD | Freq: Once | CUTANEOUS | Status: DC

## 2022-07-30 MED ORDER — SCOPOLAMINE 1 MG/3DAYS TD PT72
MEDICATED_PATCH | TRANSDERMAL | Status: AC
  Filled 2022-07-30: qty 1

## 2022-07-30 MED ORDER — PHENYLEPHRINE 80 MCG/ML (10ML) SYRINGE FOR IV PUSH (FOR BLOOD PRESSURE SUPPORT)
PREFILLED_SYRINGE | INTRAVENOUS | Status: DC | PRN
  Administered 2022-07-30 (×2): 160 ug via INTRAVENOUS

## 2022-07-30 MED ORDER — HYDROCODONE-ACETAMINOPHEN 7.5-325 MG/15ML PO SOLN
10.0000 mL | ORAL | Status: DC | PRN
  Administered 2022-07-30: 15 mL via ORAL

## 2022-07-30 MED ORDER — HYDROCODONE-ACETAMINOPHEN 7.5-325 MG PO TABS: 1.0000 | ORAL_TABLET | Freq: Once | ORAL | Status: DC | PRN

## 2022-07-30 MED ORDER — ONDANSETRON 4 MG PO TBDP: 4.0000 mg | ORAL_TABLET | Freq: Four times a day (QID) | ORAL | Status: DC | PRN

## 2022-07-30 MED ORDER — 0.9 % SODIUM CHLORIDE (POUR BTL) OPTIME
TOPICAL | Status: DC | PRN
  Administered 2022-07-30: 1000 mL

## 2022-07-30 MED ORDER — MIDAZOLAM HCL 5 MG/5ML IJ SOLN
INTRAMUSCULAR | Status: DC | PRN
  Administered 2022-07-30: 2 mg via INTRAVENOUS

## 2022-07-30 MED ORDER — PROPOFOL 10 MG/ML IV BOLUS
INTRAVENOUS | Status: DC | PRN
  Administered 2022-07-30: 200 mg via INTRAVENOUS
  Administered 2022-07-30: 10 mg via INTRAVENOUS

## 2022-07-30 MED ORDER — BUPIVACAINE LIPOSOME 1.3 % IJ SUSP
INTRAMUSCULAR | Status: DC | PRN
  Administered 2022-07-30: 20 mL

## 2022-07-30 MED ORDER — HYDROMORPHONE HCL 1 MG/ML IJ SOLN
1.0000 mg | INTRAMUSCULAR | Status: DC | PRN
  Administered 2022-07-30 – 2022-07-31 (×8): 1 mg via INTRAVENOUS
  Filled 2022-07-30 (×7): qty 1

## 2022-07-30 MED ORDER — SODIUM CHLORIDE (PF) 0.9 % IJ SOLN
INTRAMUSCULAR | Status: DC | PRN
  Administered 2022-07-30: 20 mL

## 2022-07-30 MED ORDER — ALBUMIN HUMAN 5 % IV SOLN: INTRAVENOUS | Status: DC | PRN

## 2022-07-30 MED ORDER — ORAL CARE MOUTH RINSE: 15.0000 mL | Freq: Once | OROMUCOSAL | Status: AC

## 2022-07-30 MED ORDER — FENTANYL CITRATE (PF) 250 MCG/5ML IJ SOLN
INTRAMUSCULAR | Status: AC
  Filled 2022-07-30: qty 5

## 2022-07-30 MED ORDER — CHLORHEXIDINE GLUCONATE 0.12 % MT SOLN
15.0000 mL | Freq: Once | OROMUCOSAL | Status: AC
  Administered 2022-07-30: 15 mL via OROMUCOSAL
  Filled 2022-07-30: qty 15

## 2022-07-30 MED ORDER — HYDROMORPHONE HCL 1 MG/ML IJ SOLN
INTRAMUSCULAR | Status: AC
  Filled 2022-07-30: qty 1

## 2022-07-30 MED ORDER — LIDOCAINE 2% (20 MG/ML) 5 ML SYRINGE
INTRAMUSCULAR | Status: DC | PRN
  Administered 2022-07-30: 60 mg via INTRAVENOUS

## 2022-07-30 MED ORDER — BUPIVACAINE-EPINEPHRINE (PF) 0.25% -1:200000 IJ SOLN
INTRAMUSCULAR | Status: AC
  Filled 2022-07-30: qty 30

## 2022-07-30 MED ORDER — ONDANSETRON HCL 4 MG/2ML IJ SOLN
4.0000 mg | Freq: Four times a day (QID) | INTRAMUSCULAR | Status: DC | PRN
  Administered 2022-07-30 (×2): 4 mg via INTRAVENOUS
  Filled 2022-07-30 (×2): qty 2

## 2022-07-30 MED ORDER — DEXTROSE-NACL 5-0.9 % IV SOLN
INTRAVENOUS | Status: DC
  Administered 2022-07-30: 100 mL/h via INTRAVENOUS

## 2022-07-30 MED ORDER — AMISULPRIDE (ANTIEMETIC) 5 MG/2ML IV SOLN
10.0000 mg | Freq: Once | INTRAVENOUS | Status: AC | PRN
  Administered 2022-07-30: 10 mg via INTRAVENOUS

## 2022-07-30 MED ORDER — KETAMINE HCL 10 MG/ML IJ SOLN
INTRAMUSCULAR | Status: DC | PRN
  Administered 2022-07-30: 30 mg via INTRAVENOUS

## 2022-07-30 MED ORDER — ACETAMINOPHEN 500 MG PO TABS: 1000.0000 mg | ORAL_TABLET | Freq: Once | ORAL | Status: DC

## 2022-07-30 MED ORDER — SUGAMMADEX SODIUM 200 MG/2ML IV SOLN
INTRAVENOUS | Status: DC | PRN
  Administered 2022-07-30: 200 mg via INTRAVENOUS

## 2022-07-30 MED ORDER — SUCCINYLCHOLINE CHLORIDE 200 MG/10ML IV SOSY
PREFILLED_SYRINGE | INTRAVENOUS | Status: DC | PRN
  Administered 2022-07-30: 100 mg via INTRAVENOUS

## 2022-07-30 MED ORDER — PROPOFOL 10 MG/ML IV BOLUS
INTRAVENOUS | Status: AC
  Filled 2022-07-30: qty 20

## 2022-07-30 MED ORDER — AMISULPRIDE (ANTIEMETIC) 5 MG/2ML IV SOLN
INTRAVENOUS | Status: AC
  Filled 2022-07-30: qty 4

## 2022-07-30 MED ORDER — ENSURE PRE-SURGERY PO LIQD: 296.0000 mL | Freq: Once | ORAL | Status: DC

## 2022-07-30 MED ORDER — METOCLOPRAMIDE HCL 5 MG/ML IJ SOLN
10.0000 mg | Freq: Four times a day (QID) | INTRAMUSCULAR | Status: DC | PRN
  Administered 2022-07-30: 10 mg via INTRAVENOUS
  Filled 2022-07-30: qty 2

## 2022-07-30 MED ORDER — KETAMINE HCL 50 MG/5ML IJ SOSY
PREFILLED_SYRINGE | INTRAMUSCULAR | Status: AC
  Filled 2022-07-30: qty 5

## 2022-07-30 MED ORDER — HYDROCODONE-ACETAMINOPHEN 5-325 MG PO TABS
1.0000 | ORAL_TABLET | ORAL | Status: DC | PRN
  Administered 2022-07-31: 1 via ORAL
  Filled 2022-07-30: qty 1

## 2022-07-30 MED ORDER — ACETAMINOPHEN 500 MG PO TABS
1000.0000 mg | ORAL_TABLET | Freq: Once | ORAL | Status: AC
  Filled 2022-07-30: qty 2

## 2022-07-30 SURGICAL SUPPLY — 63 items
APPLIER CLIP 5 13 M/L LIGAMAX5 (MISCELLANEOUS)
CANNULA REDUC XI 12-8 STAPL (CANNULA) ×1
CANNULA REDUCER 12-8 DVNC XI (CANNULA) ×1 IMPLANT
CHLORAPREP W/TINT 26 (MISCELLANEOUS) ×1 IMPLANT
CLIP APPLIE 5 13 M/L LIGAMAX5 (MISCELLANEOUS) IMPLANT
COVER MAYO STAND STRL (DRAPES) ×1 IMPLANT
COVER SURGICAL LIGHT HANDLE (MISCELLANEOUS) ×1 IMPLANT
COVER TIP SHEARS 8 DVNC (MISCELLANEOUS) IMPLANT
COVER TIP SHEARS 8MM DA VINCI (MISCELLANEOUS)
DEFOGGER SCOPE WARMER CLEARIFY (MISCELLANEOUS) ×1 IMPLANT
DERMABOND ADVANCED .7 DNX12 (GAUZE/BANDAGES/DRESSINGS) ×1 IMPLANT
DEVICE TROCAR PUNCTURE CLOSURE (ENDOMECHANICALS) ×1 IMPLANT
DRAIN PENROSE 0.5X18 (DRAIN) IMPLANT
DRAPE ARM DVNC X/XI (DISPOSABLE) ×4 IMPLANT
DRAPE CARDIOVASC SPLIT 88X140 (DRAPES) ×1 IMPLANT
DRAPE COLUMN DVNC XI (DISPOSABLE) ×1 IMPLANT
DRAPE DA VINCI XI ARM (DISPOSABLE) ×4
DRAPE DA VINCI XI COLUMN (DISPOSABLE) ×1
DRAPE ORTHO SPLIT 77X108 STRL (DRAPES) ×1
DRAPE SURG ORHT 6 SPLT 77X108 (DRAPES) ×1 IMPLANT
ELECT REM PT RETURN 9FT ADLT (ELECTROSURGICAL) ×1
ELECTRODE REM PT RTRN 9FT ADLT (ELECTROSURGICAL) ×1 IMPLANT
GLOVE BIO SURGEON STRL SZ7.5 (GLOVE) ×5 IMPLANT
GOWN STRL REUS W/ TWL LRG LVL3 (GOWN DISPOSABLE) ×1 IMPLANT
GOWN STRL REUS W/ TWL XL LVL3 (GOWN DISPOSABLE) ×2 IMPLANT
GOWN STRL REUS W/TWL 2XL LVL3 (GOWN DISPOSABLE) ×1 IMPLANT
GOWN STRL REUS W/TWL LRG LVL3 (GOWN DISPOSABLE) ×1
GOWN STRL REUS W/TWL XL LVL3 (GOWN DISPOSABLE) ×2
IRRIG SUCT STRYKERFLOW 2 WTIP (MISCELLANEOUS) ×1
IRRIGATION SUCT STRKRFLW 2 WTP (MISCELLANEOUS) ×1 IMPLANT
KIT BASIN OR (CUSTOM PROCEDURE TRAY) ×1 IMPLANT
KIT TURNOVER KIT B (KITS) IMPLANT
MARKER SKIN DUAL TIP RULER LAB (MISCELLANEOUS) ×1 IMPLANT
MESH BIO-A 7X10 SYN MAT (Mesh General) ×1 IMPLANT
NDL 22X1.5 STRL (OR ONLY) (MISCELLANEOUS) ×1 IMPLANT
NDL INSUFFLATION 14GA 120MM (NEEDLE) ×1 IMPLANT
NEEDLE 22X1.5 STRL (OR ONLY) (MISCELLANEOUS) ×1 IMPLANT
NEEDLE INSUFFLATION 14GA 120MM (NEEDLE) ×1 IMPLANT
OBTURATOR OPTICAL STANDARD 8MM (TROCAR)
OBTURATOR OPTICAL STND 8 DVNC (TROCAR)
OBTURATOR OPTICALSTD 8 DVNC (TROCAR) IMPLANT
PENCIL SMOKE EVACUATOR (MISCELLANEOUS) IMPLANT
SCISSORS LAP 5X35 DISP (ENDOMECHANICALS) IMPLANT
SEAL CANN UNIV 5-8 DVNC XI (MISCELLANEOUS) ×3 IMPLANT
SEAL XI 5MM-8MM UNIVERSAL (MISCELLANEOUS) ×3
SEALER VESSEL DA VINCI XI (MISCELLANEOUS) ×1
SEALER VESSEL EXT DVNC XI (MISCELLANEOUS) ×1 IMPLANT
SET TUBE SMOKE EVAC HIGH FLOW (TUBING) ×1 IMPLANT
SPIKE FLUID TRANSFER (MISCELLANEOUS) ×1 IMPLANT
STAPLER CANNULA SEAL DVNC XI (STAPLE) ×1 IMPLANT
STAPLER CANNULA SEAL XI (STAPLE) ×1
STAPLER VISISTAT 35W (STAPLE) IMPLANT
STOPCOCK 4 WAY LG BORE MALE ST (IV SETS) ×1 IMPLANT
SUT ETHIBOND 0 36 GRN (SUTURE) ×2 IMPLANT
SUT ETHIBOND 2 0 SH (SUTURE)
SUT ETHIBOND 2 0 SH 36X2 (SUTURE) IMPLANT
SUT MNCRL AB 4-0 PS2 18 (SUTURE) ×1 IMPLANT
SUT SILK 0 SH 30 (SUTURE) ×1 IMPLANT
SUT VICRYL 0 UR6 27IN ABS (SUTURE) ×1 IMPLANT
SYR 30ML SLIP (SYRINGE) ×1 IMPLANT
TRAY FOLEY MTR SLVR 16FR STAT (SET/KITS/TRAYS/PACK) ×1 IMPLANT
TRAY LAPAROSCOPIC MC (CUSTOM PROCEDURE TRAY) ×1 IMPLANT
TROCAR ADV FIXATION 5X100MM (TROCAR) ×1 IMPLANT

## 2022-07-30 NOTE — Op Note (Signed)
07/30/2022  10:47 AM  PATIENT:  Steve [name]  52 y.o. male  PRE-OPERATIVE DIAGNOSIS:  HIATAL HERNIA, CHRONIC REFLUX  POST-OPERATIVE DIAGNOSIS:  HIATAL HERNIA, CHRONIC REFLUX  PROCEDURE:  Procedure(s): XI ROBOTIC ASSISTED HIATAL HERNIA REPAIR WITH MESH AND TOUPET FUNDOPLICATION (N/A)  SURGEON:  Surgeon(s) and Role:    Ralene Ok, MD - Primary   ASSISTANTS: Pryor Curia, RNFA   ANESTHESIA:   local and general  EBL:  minimal   BLOOD ADMINISTERED:none  DRAINS: none   LOCAL MEDICATIONS USED:  BUPIVICAINE  and OTHER exparel  SPECIMEN:  No Specimen  DISPOSITION OF SPECIMEN:  N/A  COUNTS:  YES  TOURNIQUET:  * No tourniquets in log *  DICTATION: .Dragon Dictation  The patient was taken back to the operating room and placed in the supine position with bilateral SCDs in place. The patient was prepped and draped in the usual sterile fashion. After appropriate antibiotics were confirmed a timeout was called and all facts were verified.   A Veress needle technique was used to insufflate the abdomen to 15 mm of mercury the paramedian stab incision. Subsequent to this an 8 mm trocar was introduced as was a 8 millimeter camera. At this time the subsequent robotic trochars x3, were then placed adjacent to this trocar approximately 8-10 cm away. Each trocar was inserted under direct visualization, there were total of 4 trochars. A 85m trocar was placed in the midclavicular line.  A 0vicryl was placed to help with closure at the end of the case.The assistant trocar was then placed in the right lower quadrant under direct visualization. The Nathanson retractor was then visualized inserted into the abdomen and the incision just to the left of the falciform ligament. This was then placed to retract the liver appropriately. At this time the patient was positioned in reverse Trendelenburg.   At this time the robot patient cart was brought to the bedside and placed in good position and  the arms were docked to the trochars appropriately. At this time I proceeded to incised the gastrohepatic ligament.  At this time I proceeded to mobilize the stomach inferiorly and visualize the right crus. The peritoneum over the right crus was incised and right crus was identified. I proceeded to dissect this inferiorly until the left crus was seen joining the right crus. Once the right crus was adequately dissected we turned our to the left crus which was dissected away. This required traction of the stomach to the right side. Once this was visualized we then proceeded to circumferentially dissect the esophagus away from the surrounding tissue. The anterior and posterior vagus was seen along the esophagus at the GE junction.  These were both preserved throughout the entire case. At this time the phrenoesophageal fat pad was dissected away from the esophagus. There was a small-sized hiatal hernia seen. I mobilized the esophagus cephalad approximately 4-5 cm, clearing away the surrounding tissue. The anterior hernia sac was dissected away from the stomach and esophagus.  At this time we turned our attention to the greater curvature the stomach and the omentum was mobilized using the robotic vessel sealer. This was taken up to the greater curvature to the hiatus. This mobilized the entire greater curvature to allow mobilization and the wrap. I then proceeded to bring the greater curvature the stomach posterior to the esophagus, and a shoeshine technique was used to evaluate the mobilization of the greater curvature.   At this time I proceeded to close the hiatus using  interrupted 0 Ethibonds x 2. This brought together the hiatal closure without undue stricture to the esophagus.   A piece of Gore Bio A hiatal mesh was placed over the hiatal closure and sutured to the crus using 0 Ethibonds sutures x 3.  At this time the greater curvature was brought around the esophagus and sutured using 0 silk sutures  interrupted fashion approximately 1 cm apart x3 on each side of the esophagus in a Toupet fashion. A left collar stitch was then used to gastropexy the stomach from the wrap to the diaphragm just lateral to the left crus as.  A second collar stitch was placed from the wrap to the right crus.  The wrap lay loose with no strangulation of the esophagus.  At this time the robot was undocked. The liver trocar was removed. At this time insufflation was evacuated. Skin was reapproximated for Monocryl subcuticular fashion. The skin was then dressed with Dermabond. The patient tolerated the procedure well and was taken to the recovery room in stable condition.    PLAN OF CARE: Admit for overnight observation  PATIENT DISPOSITION:  PACU - hemodynamically stable.   Delay start of Pharmacological VTE agent (>24hrs) due to surgical blood loss or risk of bleeding: yes

## 2022-07-30 NOTE — Progress Notes (Signed)
Currently asleep after meds for pain/ both oral + IV / when experiences chest discomfort from surgery gets anxious and will hyperventilate / talking calmly/reassuringly w/ him will settle him.

## 2022-07-30 NOTE — Plan of Care (Signed)
  Problem: Health Behavior/Discharge Planning: Goal: Ability to manage health-related needs will improve Outcome: Progressing   Problem: Clinical Measurements: Goal: Will remain free from infection Outcome: Progressing Goal: Respiratory complications will improve Outcome: Progressing Goal: Cardiovascular complication will be avoided Outcome: Progressing   

## 2022-07-30 NOTE — Anesthesia Procedure Notes (Signed)
Procedure Name: Intubation Date/Time: 07/30/2022 9:17 AM  Performed by: Georgia Duff, CRNAPre-anesthesia Checklist: Patient identified, Emergency Drugs available, Suction available and Patient being monitored Patient Re-evaluated:Patient Re-evaluated prior to induction Oxygen Delivery Method: Circle System Utilized Preoxygenation: Pre-oxygenation with 100% oxygen Induction Type: IV induction Ventilation: Mask ventilation without difficulty Laryngoscope Size: 2 and Miller Grade View: Grade I Tube type: Oral Tube size: 7.5 mm Number of attempts: 1 Airway Equipment and Method: Stylet and Oral airway Placement Confirmation: ETT inserted through vocal cords under direct vision, positive ETCO2 and breath sounds checked- equal and bilateral Secured at: 21 cm Tube secured with: Tape Dental Injury: Teeth and Oropharynx as per pre-operative assessment

## 2022-07-30 NOTE — Anesthesia Postprocedure Evaluation (Signed)
Anesthesia Post Note  Patient: Steve [name]  Procedure(s) Performed: XI ROBOTIC ASSISTED HIATAL HERNIA REPAIR WITH MESH AND FUNDOPLICATION (Abdomen)     Patient location during evaluation: PACU Anesthesia Type: General Level of consciousness: awake and alert Pain management: pain level controlled Vital Signs Assessment: post-procedure vital signs reviewed and stable Respiratory status: spontaneous breathing, nonlabored ventilation, respiratory function stable and patient connected to nasal cannula oxygen Cardiovascular status: blood pressure returned to baseline and stable Postop Assessment: no apparent nausea or vomiting Anesthetic complications: no  No notable events documented.  Last Vitals:  Vitals:   07/30/22 1145 07/30/22 1229  BP: 121/83 128/88  Pulse: 72   Resp: (!) 22   Temp: 36.4 C   SpO2: 96%     Last Pain:  Vitals:   07/30/22 1304  TempSrc:   PainSc: Asleep                 Tyffani Foglesong,W. EDMOND

## 2022-07-30 NOTE — Progress Notes (Signed)
U;pon being made awre he was moving to 6N, stated had to void/did so. Became slightly anxious afterward and stated he was having discomfort in the area of his right collarbone. Reassured him he is ok, that it is mostlikely trapped air from his procedure, seemed to settle down after that

## 2022-07-30 NOTE — Anesthesia Preprocedure Evaluation (Addendum)
Anesthesia Evaluation  Patient identified by MRN, date of birth, ID band Patient awake    Reviewed: Allergy & Precautions, NPO status , Patient's Chart, lab work & pertinent test results  History of Anesthesia Complications (+) PONV and history of anesthetic complications  Airway Mallampati: III  TM Distance: >3 FB Neck ROM: Full    Dental  (+) Teeth Intact, Dental Advisory Given   Pulmonary neg pulmonary ROS   Pulmonary exam normal breath sounds clear to auscultation       Cardiovascular negative cardio ROS Normal cardiovascular exam Rhythm:Regular Rate:Normal     Neuro/Psych  Headaches PSYCHIATRIC DISORDERS Anxiety        GI/Hepatic Neg liver ROS, hiatal hernia,GERD  Poorly Controlled,,  Endo/Other  negative endocrine ROS    Renal/GU negative Renal ROS  negative genitourinary   Musculoskeletal  (+) Arthritis , Osteoarthritis,    Abdominal   Peds  Hematology negative hematology ROS (+)   Anesthesia Other Findings   Reproductive/Obstetrics negative OB ROS                             Anesthesia Physical Anesthesia Plan  ASA: 2  Anesthesia Plan: General   Post-op Pain Management: Tylenol PO (pre-op)*, Precedex and Dilaudid IV   Induction: Intravenous and Rapid sequence  PONV Risk Score and Plan: 4 or greater and Ondansetron, Dexamethasone, Midazolam and Treatment may vary due to age or medical condition  Airway Management Planned: Oral ETT  Additional Equipment: None  Intra-op Plan:   Post-operative Plan: Extubation in OR  Informed Consent: I have reviewed the patients History and Physical, chart, labs and discussed the procedure including the risks, benefits and alternatives for the proposed anesthesia with the patient or authorized representative who has indicated his/her understanding and acceptance.     Dental advisory given  Plan Discussed with: CRNA  Anesthesia  Plan Comments:        Anesthesia Quick Evaluation

## 2022-07-30 NOTE — Discharge Instructions (Signed)
EATING AFTER YOUR ESOPHAGEAL SURGERY (Stomach Fundoplication, Hiatal Hernia repair, Achalasia surgery, etc)  ######################################################################  EAT Start with a pureed / full liquid diet (see below) Gradually transition to a high fiber diet with a fiber supplement over the next month after discharge.    WALK Walk an hour a day.  Control your pain to do that.    CONTROL PAIN Control pain so that you can walk, sleep, tolerate sneezing/coughing, go up/down stairs.  HAVE A BOWEL MOVEMENT DAILY Keep your bowels regular to avoid problems.  OK to try a laxative to override constipation.  OK to use an antidairrheal to slow down diarrhea.  Call if not better after 2 tries  CALL IF YOU HAVE PROBLEMS/CONCERNS Call if you are still struggling despite following these instructions. Call if you have concerns not answered by these instructions  ######################################################################   After your esophageal surgery, expect some sticking with swallowing over the next 1-2 months.    If food sticks when you eat, it is called "dysphagia".  This is due to swelling around your esophagus at the wrap & hiatal diaphragm repair.  It will gradually ease off over the next few months.  To help you through this temporary phase, we start you out on a pureed (blenderized) diet.  Your first meal in the hospital was thin liquids.  You should have been given a pureed diet by the time you left the hospital.  We ask patients to stay on a pureed diet for the first 2-3 weeks to avoid anything getting "stuck" near your recent surgery.  Don't be alarmed if your ability to swallow doesn't progress according to this plan.  Everyone is different and some diets can advance more or less quickly.    It is often helpful to crush your medications or split them as they can sometimes stick, especially the first week or so.   Some BASIC RULES to follow are: Maintain  an upright position whenever eating or drinking. Take small bites - just a teaspoon size bite at a time. Eat slowly.  It may also help to eat only one food at a time. Consider nibbling through smaller, more frequent meals & avoid the urge to eat BIG meals Do not push through feelings of fullness, nausea, or bloatedness Do not mix solid foods and liquids in the same mouthful Try not to "wash foods down" with large gulps of liquids. Avoid carbonated (bubbly/fizzy) drinks.   Avoid foods that make you feel gassy or bloated.  Start with bland foods first.  Wait on trying greasy, fried, or spicy meals until you are tolerating more bland solids well. Understand that it will be hard to burp and belch at first.  This gradually improves with time.  Expect to be more gassy/flatulent/bloated initially.  Walking will help your body manage it better. Consider using medications for bloating that contain simethicone such as  Maalox or Gas-X  Consider crushing her medications, especially smaller pills.  The ability to swallow pills should get easier after a few weeks Eat in a relaxed atmosphere & minimize distractions. Avoid talking while eating.   Do not use straws. Following each meal, sit in an upright position (90 degree angle) for 60 to 90 minutes.  Going for a short walk can help as well If food does stick, don't panic.  Try to relax and let the food pass on its own.  Sipping WARM LIQUID such as strong hot black tea can also help slide it down.  Be gradual in changes & use common sense:  -If you easily tolerating a certain "level" of foods, advance to the next level gradually -If you are having trouble swallowing a particular food, then avoid it.   -If food is sticking when you advance your diet, go back to thinner previous diet (the lower LEVEL) for 1-2 days.  LEVEL 1 = PUREED DIET  Do for the first 2 WEEKS AFTER SURGERY  -Foods in this group are pureed or blenderized to a smooth, mashed  potato-like consistency.  -If necessary, the pureed foods can keep their shape with the addition of a thickening agent.   -Meat should be pureed to a smooth, pasty consistency.  Hot broth or gravy may be added to the pureed meat, approximately 1 oz. of liquid per 3 oz. serving of meat. -CAUTION:  If any foods do not puree into a smooth consistency, swallowing will be more difficult.  (For example, nuts or seeds sometimes do not blend well.)  Hot Foods Cold Foods  Pureed scrambled eggs and cheese Pureed cottage cheese  Baby cereals Thickened juices and nectars  Thinned cooked cereals (no lumps) Thickened milk or eggnog  Pureed [place] toast or pancakes Ensure  Mashed potatoes Ice cream  Pureed parsley, au gratin, scalloped potatoes, candied sweet potatoes Fruit or [place] ice, sherbet  Pureed buttered or alfredo noodles Plain yogurt  Pureed vegetables (no corn or peas) Instant breakfast  Pureed soups and creamed soups Smooth pudding, mousse, custard  Pureed scalloped apples Whipped gelatin  Gravies Sugar, syrup, honey, jelly  Sauces, cheese, tomato, barbecue, white, creamed Cream  Any baby food Creamer  Alcohol in moderation (not beer or champagne) Margarine  Coffee or tea Mayonnaise   Ketchup, mustard   Apple sauce   SAMPLE MENU:  PUREED DIET Breakfast Lunch Dinner  Orange juice, 1/2 cup Cream of wheat, 1/2 cup Pineapple juice, 1/2 cup Pureed [place], barley soup, 3/4 cup Pureed Hawaiian chicken, 3 oz  Scrambled eggs, mashed or blended with cheese, 1/2 cup Tea or coffee, 1 cup  Whole milk, 1 cup  Non-dairy creamer, 2 Tbsp. Mashed potatoes, 1/2 cup Pureed cooled broccoli, 1/2 cup Apple sauce, 1/2 cup Coffee or tea Mashed potatoes, 1/2 cup Pureed spinach, 1/2 cup Frozen yogurt, 1/2 cup Tea or coffee      LEVEL 2 = SOFT DIET  After your first 2 weeks, you can advance to a soft diet.   Keep on this diet until everything goes down easily.  Hot Foods Cold Foods  White fish  Cottage cheese  Stuffed fish Junior baby fruit  Baby food meals Semi thickened juices  Minced soft cooked, scrambled, poached eggs nectars  Souffle & omelets Ripe mashed bananas  Cooked cereals Canned fruit, pineapple sauce, milk  potatoes Milkshake  Buttered or Alfredo noodles Custard  Cooked cooled vegetable Puddings, including tapioca  Sherbet Yogurt  Vegetable soup or alphabet soup Fruit ice, [place] ice  Gravies Whipped gelatin  Sugar, syrup, honey, jelly Junior baby desserts  Sauces:  Cheese, creamed, barbecue, tomato, white Cream  Coffee or tea Margarine   SAMPLE MENU:  LEVEL 2 Breakfast Lunch Dinner  Orange juice, 1/2 cup Oatmeal, 1/2 cup Scrambled eggs with cheese, 1/2 cup Decaffeinated tea, 1 cup Whole milk, 1 cup Non-dairy creamer, 2 Tbsp Pineapple juice, 1/2 cup Minced beef, 3 oz Gravy, 2 Tbsp Mashed potatoes, 1/2 cup Minced fresh broccoli, 1/2 cup Applesauce, 1/2 cup Coffee, 1 cup [place], barley soup, 3/4 cup Minced Hawaiian chicken, 3 oz  Mashed potatoes, 1/2 cup Cooked spinach, 1/2 cup Frozen yogurt, 1/2 cup Non-dairy creamer, 2 Tbsp      LEVEL 3 = CHOPPED DIET  -After all the foods in level 2 (soft diet) are passing through well you should advance up to more chopped foods.  -It is still important to cut these foods into small pieces and eat slowly.  Hot Foods Cold Foods  Poultry Cottage cheese  Chopped Swedish meatballs Yogurt  Meat salads (ground or flaked meat) Milk  Flaked fish (tuna) Milkshakes  Poached or scrambled eggs Soft, cold, dry cereal  Souffles and omelets Fruit juices or nectars  Cooked cereals Chopped canned fruit  Chopped [place] toast or pancakes Canned fruit cocktail  Noodles or pasta (no rice) Pudding, mousse, custard  Cooked vegetables (no frozen peas, corn, or mixed vegetables) Green salad  Canned small sweet peas Ice cream  Creamed soup or vegetable soup Fruit ice, [place] ice  Pureed vegetable soup or alphabet soup  Non-dairy creamer  Ground scalloped apples Margarine  Gravies Mayonnaise  Sauces:  Cheese, creamed, barbecue, tomato, white Ketchup  Coffee or tea Mustard   SAMPLE MENU:  LEVEL 3 Breakfast Lunch Dinner  Orange juice, 1/2 cup Oatmeal, 1/2 cup Scrambled eggs with cheese, 1/2 cup Decaffeinated tea, 1 cup Whole milk, 1 cup Non-dairy creamer, 2 Tbsp Ketchup, 1 Tbsp Margarine, 1 tsp Salt, 1/4 tsp Sugar, 2 tsp Pineapple juice, 1/2 cup Ground beef, 3 oz Gravy, 2 Tbsp Mashed potatoes, 1/2 cup Cooked spinach, 1/2 cup Applesauce, 1/2 cup Decaffeinated coffee Whole milk Non-dairy creamer, 2 Tbsp Margarine, 1 tsp Salt, 1/4 tsp Pureed [place], barley soup, 3/4 cup Barbecue chicken, 3 oz Mashed potatoes, 1/2 cup Ground fresh broccoli, 1/2 cup Frozen yogurt, 1/2 cup Decaffeinated tea, 1 cup Non-dairy creamer, 2 Tbsp Margarine, 1 tsp Salt, 1/4 tsp Sugar, 1 tsp    LEVEL 4:  REGULAR FOODS  -Foods in this group are soft, moist, regularly textured foods.   -This level includes meat and breads, which tend to be the hardest things to swallow.   -Eat very slowly, chew well and continue to avoid carbonated drinks. -most people are at this level in 4-6 weeks  Hot Foods Cold Foods  Baked fish or skinned Soft cheeses - cottage cheese  Souffles and omelets Cream cheese  Eggs Yogurt  Stuffed shells Milk  Spaghetti with meat sauce Milkshakes  Cooked cereal Cold dry cereals (no nuts, dried fruit, coconut)  [place] toast or pancakes Crackers  Buttered toast Fruit juices or nectars  Noodles or pasta (no rice) Canned fruit  Potatoes (all types) Ripe bananas  Soft, cooked vegetables (no corn, lima, or baked beans) Peeled, ripe, fresh fruit  Creamed soups or vegetable soup Cakes (no nuts, dried fruit, coconut)  Canned chicken noodle soup Plain doughnuts  Gravies Ice cream  Bacon dressing Pudding, mousse, custard  Sauces:  Cheese, creamed, barbecue, tomato, white Fruit ice, [place] ice, sherbet   Decaffeinated tea or coffee Whipped gelatin  Pork chops Regular gelatin   Canned fruited gelatin molds   Sugar, syrup, honey, jam, jelly   Cream   Non-dairy   Margarine   Oil   Mayonnaise   Ketchup   Mustard   TROUBLESHOOTING IRREGULAR BOWELS  1) Avoid extremes of bowel movements (no bad constipation/diarrhea)  2) Miralax 17gm mixed in 8oz. water or juice-daily. May use BID as needed.  3) Gas-x,Phazyme, etc. as needed for gas & bloating.  4) Soft,bland diet. No spicy,greasy,fried foods.  5) Prilosec over-the-counter  as needed  6) May hold gluten/wheat products from diet to see if symptoms improve.  7) May try probiotics (Align, Activa, etc) to help calm the bowels down  7) If symptoms become worse call back immediately.    If you have any questions please call our office at [place]: [phone].

## 2022-07-30 NOTE — Interval H&P Note (Signed)
History and Physical Interval Note:  07/30/2022 8:24 AM  Steve [name]  has presented today for surgery, with the diagnosis of HIATAL HERNIA REFLUX.  The various methods of treatment have been discussed with the patient and family. After consideration of risks, benefits and other options for treatment, the patient has consented to  Procedure(s): XI ROBOTIC ASSISTED HIATAL HERNIA REPAIR WITH MESH AND FUNDOPLICATION (N/A) as a surgical intervention.  The patient's history has been reviewed, patient examined, no change in status, stable for surgery.  I have reviewed the patient's chart and labs.  Questions were answered to the patient's satisfaction.     Ralene Ok

## 2022-07-30 NOTE — Transfer of Care (Signed)
Immediate Anesthesia Transfer of Care Note  Patient: Steve [name]  Procedure(s) Performed: XI ROBOTIC ASSISTED HIATAL HERNIA REPAIR WITH MESH AND FUNDOPLICATION (Abdomen)  Patient Location: PACU  Anesthesia Type:General  Level of Consciousness: drowsy and patient cooperative  Airway & Oxygen Therapy: Patient Spontanous Breathing  Post-op Assessment: Report given to RN and Post -op Vital signs reviewed and stable  Post vital signs: Reviewed and stable  Last Vitals:  Vitals Value Taken Time  BP 119/87 07/30/22 1106  Temp 36.7 C 07/30/22 1106  Pulse 80 07/30/22 1113  Resp 26 07/30/22 1113  SpO2 93 % 07/30/22 1113  Vitals shown include unvalidated device data.  Last Pain:  Vitals:   07/30/22 1106  TempSrc:   PainSc: Asleep         Complications: No notable events documented.

## 2022-07-31 ENCOUNTER — Encounter (HOSPITAL_COMMUNITY): Payer: Self-pay | Admitting: General Surgery

## 2022-07-31 ENCOUNTER — Observation Stay (HOSPITAL_COMMUNITY): Payer: BC Managed Care – PPO

## 2022-07-31 DIAGNOSIS — K449 Diaphragmatic hernia without obstruction or gangrene: Secondary | ICD-10-CM | POA: Diagnosis not present

## 2022-07-31 LAB — CBC
HCT: 39.7 % (ref 39.0–52.0)
Hemoglobin: 13.7 g/dL (ref 13.0–17.0)
MCH: 29 pg (ref 26.0–34.0)
MCHC: 34.5 g/dL (ref 30.0–36.0)
MCV: 84.1 fL (ref 80.0–100.0)
Platelets: 224 10*3/uL (ref 150–400)
RBC: 4.72 MIL/uL (ref 4.22–5.81)
RDW: 13.1 % (ref 11.5–15.5)
WBC: 11.3 10*3/uL — ABNORMAL HIGH (ref 4.0–10.5)
nRBC: 0 % (ref 0.0–0.2)

## 2022-07-31 MED ORDER — IOHEXOL 300 MG/ML  SOLN
75.0000 mL | Freq: Once | INTRAMUSCULAR | Status: AC | PRN
  Administered 2022-07-31: 75 mL via ORAL

## 2022-07-31 MED ORDER — HYDROCODONE-ACETAMINOPHEN 7.5-325 MG/15ML PO SOLN: 15.0000 mL | Freq: Four times a day (QID) | ORAL | 0 refills | Status: AC | PRN

## 2022-07-31 NOTE — Discharge Summary (Signed)
Physician Discharge Summary  Patient ID: Steve [name] MRN: [id number] DOB/AGE: [birth year] 52 y.o.  Admit date: 07/30/2022 Discharge date: 07/31/2022  Admission Diagnoses:chronic GERD  Discharge Diagnoses:  Principal Problem:   S/P Nissen fundoplication (without gastrostomy tube) procedure   Discharged Condition: good  Hospital Course: PT admitted post op.  PT did well post op and had good pain control.  He was started on a liquid diet and tol that well.  He hada DG esoph with no leak.  He was transitioned to a pureed diet.    He was otherwuse amb well on his own and deemed stabelfor DC and DC'dhome  Consults: None  Significant Diagnostic Studies: DG esop with no leak  Treatments: surgery: as above  Discharge Exam: Blood pressure 127/88, pulse 73, temperature 98.9 F (37.2 C), temperature source Oral, resp. rate 18, height 5' 5"$  (1.651 m), weight 72.6 kg, SpO2 100 %. General appearance: alert and cooperative GI: soft, non-tender; bowel sounds normal; no masses,  no organomegaly and inc c/d/i  Disposition: Discharge disposition: 01-Home or Self Care       Discharge Instructions     Diet - low sodium heart healthy   Complete by: As directed    Increase activity slowly   Complete by: As directed       Allergies as of 07/31/2022       Reactions   Oxycodone Nausea And Vomiting        Medication List     TAKE these medications    HYDROcodone-acetaminophen 7.5-325 mg/15 ml solution Commonly known as: HYCET Take 15 mLs by mouth 4 (four) times daily as needed for moderate pain.   omeprazole 20 MG capsule Commonly known as: PRILOSEC Take 1 capsule (20 mg total) by mouth 2 (two) times daily before a meal. What changed:  how much to take when to take this   SUMAtriptan 20 MG/ACT nasal spray Commonly known as: Imitrex 1 spray into nostril at onset of migraine. May repeat in 2 hours if headache persists or recurs. ( max 16m/24hr)        Follow-up  Information     RRalene Ok MD. Schedule an appointment as soon as possible for a visit in 2 week(s).   Specialty: General Surgery Why: Post op visit Contact information: [phone]                 Signed: ARalene Ok2/21/2024, 1:26 PM

## 2022-07-31 NOTE — Care Management (Signed)
  Transition of Care [facility]) Screening Note   Patient Details  Name: Steve [name] Date of Birth: Dec 15, 1970   [facility]) CM/SW Contact:    Carles Collet, RN Phone Number: 07/31/2022, 7:52 AM    Transition of Care Department [facility]) has reviewed patient and no TOC needs have been identified at this time. We will continue to monitor patient advancement through interdisciplinary progression rounds. If new patient transition needs arise, please place a TOC consult.

## 2022-07-31 NOTE — Progress Notes (Signed)
Nutrition Education Note   RD received a consult for diet education s/p Nissen Fundoplication.   RD met with pt in room while pt was eating lunch. Discussed eating a pureed diet for 2 weeks, and then progressing to a soft diet. Reviewed foods that are pureed or can easily be pureed. Recommend eating small frequent meals. No carbonation or straws while drinking. Recommended that pt utilize protein shakes to meet calorie and protein need within the first two weeks. Additional handout in discharge instructions from MD. Pt with no additional questions at this time.    Hermina Barters RD, LDN Clinical Dietitian See Shea Evans for contact information.

## 2022-07-31 NOTE — Progress Notes (Signed)
1 Day Post-Op   Subjective/Chief Complaint: Doing well this AM Some soreness   Objective: Vital signs in last 24 hours: Temp:  [97.4 F (36.3 C)-98.6 F (37 C)] 98.6 F (37 C) (02/21 0407) Pulse Rate:  [70-87] 79 (02/21 0407) Resp:  [14-27] 16 (02/20 1836) BP: (119-153)/(83-98) 143/98 (02/21 0407) SpO2:  [90 %-97 %] 90 % (02/21 0407) Weight:  [72.6 kg] 72.6 kg (02/20 0741)    Intake/Output from previous day: 02/20 0701 - 02/21 0700 In: 2893 [P.O.:120; I.V.:2173; IV Piggyback:600] Out: 745 [Urine:745] Intake/Output this shift: No intake/output data recorded.  General appearance: alert and cooperative GI: soft, non-tender; bowel sounds normal; no masses,  no organomegaly and inc c/d/i  Lab Results:  Recent Labs    07/31/22 0245  WBC 11.3*  HGB 13.7  HCT 39.7  PLT 224   BMET No results for input(s): "NA", "K", "CL", "CO2", "GLUCOSE", "BUN", "CREATININE", "CALCIUM" in the last 72 hours. PT/INR No results for input(s): "LABPROT", "INR" in the last 72 hours. ABG No results for input(s): "PHART", "HCO3" in the last 72 hours.  Invalid input(s): "PCO2", "PO2"  Studies/Results: No results found.  Anti-infectives: Anti-infectives (From admission, onward)    Start     Dose/Rate Route Frequency Ordered Stop   07/30/22 0745  ceFAZolin (ANCEF) IVPB 2g/100 mL premix        2 g 200 mL/hr over 30 Minutes Intravenous On call to O.R. 07/30/22 0735 07/30/22 0920       Assessment/Plan: s/p Procedure(s): XI ROBOTIC ASSISTED HIATAL HERNIA REPAIR WITH MESH AND FUNDOPLICATION (N/A) -await DG esoph if [name] will adv to pureed diet -mobilize -hopefully home later today  LOS: 0 days    Steve [name] 07/31/2022

## 2022-07-31 NOTE — Progress Notes (Signed)
   07/31/22 1141  Mobility  Activity Ambulated with assistance in hallway  Level of Assistance Modified independent, requires aide device or extra time  Assistive Device None  Distance Ambulated (ft) 50 ft  Activity Response Tolerated fair  Mobility Referral Yes  $Mobility charge 1 Mobility   Mobility Specialist Progress Note  Pt in bed and agreeable. Had c/o abdominal pain throughout ambulation with a rating of an 8/10. Returned to chair w/ all needs met and call bell in reach. RN notified.   Lucious Groves Mobility Specialist  Please contact via SecureChat or Rehab office at [phone]

## 2022-07-31 NOTE — Progress Notes (Signed)
Alyssa Grove to be D/C'd  per MD order.  Discussed with the patient and all questions fully answered.  VSS, Skin clean, dry and intact without evidence of skin break down, no evidence of skin tears noted.  IV catheter discontinued intact. Site without signs and symptoms of complications. Dressing and pressure applied.  An After Visit Summary was printed and given to the patient.   D/c education completed with patient/family including follow up instructions, medication list, d/c activities limitations if indicated, with other d/c instructions as indicated by MD - patient able to verbalize understanding, all questions fully answered.   Patient instructed to return to ED, call 911, or call MD for any changes in condition.   Patient to be escorted via [place], and D/C home via private auto.

## 2024-07-08 ENCOUNTER — Emergency Department (HOSPITAL_COMMUNITY)
Admission: EM | Admit: 2024-07-08 | Discharge: 2024-07-08 | Disposition: A | Discharge status: Home / Self Care | Attending: Emergency Medicine | Admitting: Emergency Medicine

## 2024-07-08 ENCOUNTER — Emergency Department (HOSPITAL_COMMUNITY)

## 2024-07-08 DIAGNOSIS — R55 Syncope and collapse: Secondary | ICD-10-CM | POA: Insufficient documentation

## 2024-07-08 LAB — CBC WITH DIFFERENTIAL/PLATELET
Abs Immature Granulocytes: 0.03 10*3/uL (ref 0.00–0.07)
Basophils Absolute: 0.1 10*3/uL (ref 0.0–0.1)
Basophils Relative: 1 %
Eosinophils Absolute: 0.2 10*3/uL (ref 0.0–0.5)
Eosinophils Relative: 2 %
HCT: 41.6 % (ref 39.0–52.0)
Hemoglobin: 14 g/dL (ref 13.0–17.0)
Immature Granulocytes: 0 %
Lymphocytes Relative: 39 %
Lymphs Abs: 2.7 10*3/uL (ref 0.7–4.0)
MCH: 28.9 pg (ref 26.0–34.0)
MCHC: 33.7 g/dL (ref 30.0–36.0)
MCV: 85.8 fL (ref 80.0–100.0)
Monocytes Absolute: 0.6 10*3/uL (ref 0.1–1.0)
Monocytes Relative: 9 %
Neutro Abs: 3.4 10*3/uL (ref 1.7–7.7)
Neutrophils Relative %: 49 %
Platelets: 252 10*3/uL (ref 150–400)
RBC: 4.85 MIL/uL (ref 4.22–5.81)
RDW: 13 % (ref 11.5–15.5)
WBC: 7 10*3/uL (ref 4.0–10.5)
nRBC: 0 % (ref 0.0–0.2)

## 2024-07-08 LAB — COMPREHENSIVE METABOLIC PANEL WITH GFR
ALT: 23 U/L (ref 0–44)
AST: 28 U/L (ref 15–41)
Albumin: 4.1 g/dL (ref 3.5–5.0)
Alkaline Phosphatase: 71 U/L (ref 38–126)
Anion gap: 11 (ref 5–15)
BUN: 14 mg/dL (ref 6–20)
CO2: 23 mmol/L (ref 22–32)
Calcium: 9.4 mg/dL (ref 8.9–10.3)
Chloride: 106 mmol/L (ref 98–111)
Creatinine, Ser: 1.2 mg/dL (ref 0.61–1.24)
GFR, Estimated: >60 mL/min
Glucose, Bld: 75 mg/dL (ref 70–99)
Potassium: 4.1 mmol/L (ref 3.5–5.1)
Sodium: 140 mmol/L (ref 135–145)
Total Bilirubin: 0.9 mg/dL (ref 0.0–1.2)
Total Protein: 6.7 g/dL (ref 6.5–8.1)

## 2024-07-08 LAB — TROPONIN T, HIGH SENSITIVITY
Troponin T High Sensitivity: 7 ng/L (ref 0–19)
Troponin T High Sensitivity: 10 ng/L (ref 0–19)

## 2024-07-08 LAB — CBG MONITORING, ED: Glucose-Capillary: 73 mg/dL (ref 70–99)

## 2024-07-08 MED ORDER — SODIUM CHLORIDE 0.9 % IV BOLUS
1000.0000 mL | Freq: Once | INTRAVENOUS | Status: AC
  Administered 2024-07-08: 1000 mL via INTRAVENOUS

## 2024-07-08 MED ORDER — ACETAMINOPHEN 500 MG PO TABS
1000.0000 mg | ORAL_TABLET | Freq: Once | ORAL | Status: AC
  Administered 2024-07-08: 1000 mg via ORAL
  Filled 2024-07-08: qty 2

## 2024-07-08 NOTE — ED Triage Notes (Addendum)
 Pt was visiting a family member being seen in the ED when pt experience a syncopal episode from standing. Reports hx of syncopal episodes and reports possible POTS but not formally diagnosed.   A&O x4 after syncopal episode. Reports pain to head and left hip. Pt placed in c-collar and placed into ED stretcher while maintaining C spine.

## 2024-07-08 NOTE — ED Provider Notes (Signed)
 " [facility] Provider Note   CSN: [id number] Arrival date & time: 07/08/24  2051     Patient presents with: Loss of Consciousness   Steve [name] is a 54 y.o. male with a past medical history of migraines, GERD presents to emergency department for evaluation of syncopal episode.  He was waiting with his wife who is currently patient in the ED when he suddenly felt hot and saw floaters then woke up supine on the ground with a nurse over top of him.  Patient reports that he has syncopal episodes once a month for the past several years. Prolonged periods wo eating are typically precipitating factors for syncope. He last ate a hamburger 10 hours ago and has not eaten since being in ED. This is typically how his syncopal episodes present. Has not bee evaluated for syncope in past. Although fall was not witnessed, and nurse  heard in fall and assessed him on the ground.  There was no postictal confusion, seizure-like activity.    Loss of Consciousness      Prior to Admission medications  Medication Sig Start Date End Date Taking? Authorizing Provider  omeprazole  (PRILOSEC) 20 MG capsule Take 1 capsule (20 mg total) by mouth 2 (two) times daily before a meal. Patient taking differently: Take 40 mg by mouth daily. 10/22/19   Armbruster, Elspeth SQUIBB, MD  SUMAtriptan  (IMITREX ) 20 MG/ACT nasal spray 1 spray into nostril at onset of migraine. May repeat in 2 hours if headache persists or recurs. ( max 40mg sophronia) 07/23/18   Midge Sober, DO    Allergies: Oxycodone    Review of Systems  Cardiovascular:  Positive for syncope.    Updated Vital Signs BP (!) 146/88   Pulse 66   Temp 98.7 F (37.1 C) (Oral)   Resp 16   SpO2 100%   Physical Exam Vitals and nursing note reviewed.  Constitutional:      General: He is not in acute distress.    Appearance: Normal appearance. He is not ill-appearing.  HENT:     Head: Normocephalic and atraumatic.   Eyes:     General: Lids are normal. Vision grossly intact. No visual field deficit.    Extraocular Movements: Extraocular movements intact.     Right eye: Normal extraocular motion and no nystagmus.     Left eye: Normal extraocular motion and no nystagmus.     Conjunctiva/sclera: Conjunctivae normal.     Pupils: Pupils are equal, round, and reactive to light.  Cardiovascular:     Rate and Rhythm: Normal rate.     Heart sounds: Normal heart sounds.  Pulmonary:     Effort: Pulmonary effort is normal. No respiratory distress.     Breath sounds: Normal breath sounds.  Chest:     Chest wall: No tenderness.  Abdominal:     General: Bowel sounds are normal. There is no distension.     Palpations: Abdomen is soft.     Tenderness: There is no abdominal tenderness. There is no guarding or rebound.  Musculoskeletal:     Cervical back: Normal range of motion and neck supple. No rigidity or bony tenderness.     Thoracic back: No bony tenderness.     Lumbar back: No bony tenderness.     Comments: Moving all extremities equally bilaterally  Skin:    Coloration: Skin is not jaundiced or pale.     Comments: No ecchymosis to chest, abdomen, back  Neurological:  General: No focal deficit present.     Mental Status: He is alert and oriented to person, place, and time. Mental status is at baseline.     GCS: GCS eye subscore is 4. GCS verbal subscore is 5. GCS motor subscore is 6.     Cranial Nerves: No cranial nerve deficit, dysarthria or facial asymmetry.     Sensory: No sensory deficit.     Motor: No weakness, abnormal muscle tone, seizure activity or pronator drift.     Coordination: Coordination normal. Finger-Nose-Finger Test and Heel to [facility] Test normal.     Gait: Gait normal.     Deep Tendon Reflexes: Reflexes normal.     Comments: A&Ox3.  Motor 5/5 and sensation 2/2 BUE and BLE.  No visual nor speech deficits.  No agnosia.  No nystagmus.  Denies dizziness     (all labs ordered are  listed, but only abnormal results are displayed) Labs Reviewed  CBC WITH DIFFERENTIAL/PLATELET  COMPREHENSIVE METABOLIC PANEL WITH GFR  CBG MONITORING, ED  TROPONIN T, HIGH SENSITIVITY  TROPONIN T, HIGH SENSITIVITY    EKG: None  Radiology: CT Cervical Spine Wo Contrast Result Date: 07/08/2024 EXAM: CT CERVICAL SPINE WITHOUT CONTRAST 07/08/2024 10:53:53 PM TECHNIQUE: CT of the cervical spine was performed without the administration of intravenous contrast. Multiplanar reformatted images are provided for review. Automated exposure control, iterative reconstruction, and/or weight based adjustment of the mA/kV was utilized to reduce the radiation dose to as low as reasonably achievable. COMPARISON: None available. CLINICAL HISTORY: fall Fall. FINDINGS: BONES AND ALIGNMENT: No acute fracture or traumatic malalignment. DEGENERATIVE CHANGES: Multilevel mild-to-moderate degenerative changes of the spine, most pronounced at the C5 to C7 levels. SOFT TISSUES: No prevertebral soft tissue swelling. IMPRESSION: 1. No evidence of acute traumatic injury. Electronically signed by: Morgane Naveau MD 07/08/2024 11:01 PM EST RP Workstation: [id number]   CT Head Wo Contrast Result Date: 07/08/2024 EXAM: CT HEAD WITHOUT CONTRAST 07/08/2024 10:53:53 PM TECHNIQUE: CT of the head was performed without the administration of intravenous contrast. Automated exposure control, iterative reconstruction, and/or weight based adjustment of the mA/kV was utilized to reduce the radiation dose to as low as reasonably achievable. COMPARISON: None available. CLINICAL HISTORY: fall. Fall. FINDINGS: BRAIN AND VENTRICLES: No acute hemorrhage. No evidence of acute infarct. No hydrocephalus. No extra-axial collection. No mass effect or midline shift. ORBITS: No acute abnormality. SINUSES: No acute abnormality. SOFT TISSUES AND SKULL: No acute soft tissue abnormality. No skull fracture. IMPRESSION: 1. No acute intracranial abnormality.  Electronically signed by: Morgane Naveau MD 07/08/2024 10:59 PM EST RP Workstation: [id number]   DG HIP UNILAT WITH PELVIS 2-3 VIEWS LEFT Result Date: 07/08/2024 EXAM: 2-3 VIEW(S) XRAY OF THE HIP 07/08/2024 09:22:00 PM COMPARISON: None available. CLINICAL HISTORY: Fall. ICD9 9926 Syncope and collapse. FINDINGS: BONES AND JOINTS: No acute fracture. No malalignment. SOFT TISSUES: Unremarkable. IMPRESSION: 1. No acute fracture or dislocation. Electronically signed by: Morgane Naveau MD 07/08/2024 09:25 PM EST RP Workstation: [id number]    Medications Ordered in the ED  sodium chloride  0.9 % bolus 1,000 mL (0 mLs Intravenous Stopped 07/08/24 2343)  acetaminophen  (TYLENOL ) tablet 1,000 mg (1,000 mg Oral Given 07/08/24 2346)                                    Medical Decision Making Amount and/or Complexity of Data Reviewed Labs: ordered. Radiology: ordered.  Risk OTC drugs.    Patient presents  to the ED for concern of syncope, this involves an extensive number of treatment options, and is a complaint that carries with it a high risk of complications and morbidity.  The differential diagnosis includes cardiac syncope, vasovagal syncope, orthostatic hypotension, infection, electrolyte abnormality, dehydration   Co morbidities that complicate the patient evaluation  Recurrent syncope   Additional history obtained:  Additional history obtained from [facility] and Nursing   External records from outside source obtained and reviewed including triage RN note, wife at bedside   Lab Tests:  I Ordered, and personally interpreted labs.  The pertinent results include:   CBG 73 Troponin WNL   Imaging Studies ordered:  I ordered imaging studies including left hip x-ray, CT head, cervical spine I independently visualized and interpreted imaging which showed no acute traumatic injury I agree with the radiologist interpretation See individual report for specific impression   Cardiac  Monitoring:  The patient was maintained on a cardiac monitor.  I personally viewed and interpreted the cardiac monitored which showed an underlying rhythm of: sinus bradycardia with no ischemic changes    Problem List / ED Course:  Recurrent syncope Vital signs notable for mild hypertension of 162/99 but otherwise unremarkable.  No fever no tachycardia.  No hypoxia. Complains of left-sided head pain, left hip pain Neurologically intact with no motor or neurosensory deficits.  No speech nor visual deficits. Patient reports that he has had these similar syncopal episodes with similar preceding symptoms at least once a month for the past several years. No complaints of chest pain prior to syncope.  Troponin negative. Electrolytes within normal limits.  CBG 73.  No leukocytosis. Left hip x-ray without fracture nor dislocation CT head wo ICH Patient ambulated without difficulty.  Following IVF, he feels completely back to baseline Patient's wife to remain with patient overnight and can keep an eye on him As this is happened multiple times in the past and seems similar to previous syncopal episodes, patient to follow-up with PCP.  Do not see any emergent or acute etiology of syncope at this time   Reevaluation:  After the interventions noted above, I reevaluated the patient and found that they have :resolved    Dispostion:  After consideration of the diagnostic results and the patients response to treatment, I feel that the patent would benefit from outpatient management with PCP f/u.   Discussed ED workup, disposition, return to ED precautions with patient who expresses understanding agrees with plan.  All questions answered to their satisfaction.  They are agreeable to plan.  Discharge instructions provided on paperwork  Final diagnoses:  Syncope and collapse    ED Discharge Orders     None        Minnie Tinnie BRAVO, [place] 07/09/24 0017  "

## 2024-07-08 NOTE — Discharge Instructions (Signed)
 Thank you for letting us  evaluate you today.  Your cardiac enzymes are normal.  Your electrolytes are normal.  Your CT imaging of your head did not show any bleeding.  Your x-ray of your hip did not show any fracture or dislocation.  Please follow-up with PCP regarding symptom control  Return to ED for experience chest pain, shortness of breath
